# Patient Record
Sex: Female | Born: 1965 | Race: White | Hispanic: No | Marital: Married | Smoking: Never smoker
Health system: Southern US, Community
[De-identification: ages and names within clinical notes are randomized; demographics above are authoritative.]

## PROBLEM LIST (undated history)

## (undated) DIAGNOSIS — E785 Hyperlipidemia, unspecified: Secondary | ICD-10-CM

## (undated) DIAGNOSIS — E039 Hypothyroidism, unspecified: Secondary | ICD-10-CM

## (undated) HISTORY — DX: Hyperlipidemia, unspecified: E78.5

## (undated) HISTORY — DX: Hypothyroidism, unspecified: E03.9

---

## 1998-01-10 ENCOUNTER — Other Ambulatory Visit: Admission: RE | Admit: 1998-01-10 | Discharge: 1998-01-10 | Payer: Self-pay | Admitting: Obstetrics and Gynecology

## 1998-06-27 ENCOUNTER — Inpatient Hospital Stay (HOSPITAL_COMMUNITY): Admission: AD | Admit: 1998-06-27 | Discharge: 1998-06-27 | Payer: Self-pay | Admitting: Obstetrics and Gynecology

## 1998-07-03 ENCOUNTER — Inpatient Hospital Stay (HOSPITAL_COMMUNITY): Admission: AD | Admit: 1998-07-03 | Discharge: 1998-07-07 | Payer: Self-pay | Admitting: Obstetrics and Gynecology

## 1998-07-29 ENCOUNTER — Encounter (HOSPITAL_COMMUNITY): Admission: RE | Admit: 1998-07-29 | Discharge: 1998-10-27 | Payer: Self-pay | Admitting: Obstetrics & Gynecology

## 1998-08-04 ENCOUNTER — Other Ambulatory Visit: Admission: RE | Admit: 1998-08-04 | Discharge: 1998-08-04 | Payer: Self-pay | Admitting: Obstetrics and Gynecology

## 2000-04-22 ENCOUNTER — Other Ambulatory Visit: Admission: RE | Admit: 2000-04-22 | Discharge: 2000-04-22 | Payer: Self-pay | Admitting: Obstetrics and Gynecology

## 2001-05-05 ENCOUNTER — Other Ambulatory Visit: Admission: RE | Admit: 2001-05-05 | Discharge: 2001-05-05 | Payer: Self-pay | Admitting: Obstetrics and Gynecology

## 2002-06-24 ENCOUNTER — Ambulatory Visit (HOSPITAL_COMMUNITY): Admission: RE | Admit: 2002-06-24 | Discharge: 2002-06-24 | Payer: Self-pay | Admitting: Student

## 2002-06-24 ENCOUNTER — Encounter: Payer: Self-pay | Admitting: Obstetrics and Gynecology

## 2003-04-02 ENCOUNTER — Encounter: Payer: Self-pay | Admitting: Obstetrics and Gynecology

## 2003-04-02 ENCOUNTER — Inpatient Hospital Stay (HOSPITAL_COMMUNITY): Admission: RE | Admit: 2003-04-02 | Discharge: 2003-04-06 | Payer: Self-pay | Admitting: Obstetrics and Gynecology

## 2003-04-03 ENCOUNTER — Encounter (INDEPENDENT_AMBULATORY_CARE_PROVIDER_SITE_OTHER): Payer: Self-pay | Admitting: *Deleted

## 2003-04-07 ENCOUNTER — Encounter: Payer: Self-pay | Admitting: Internal Medicine

## 2003-04-07 ENCOUNTER — Encounter: Payer: Self-pay | Admitting: Emergency Medicine

## 2003-04-08 ENCOUNTER — Observation Stay (HOSPITAL_COMMUNITY): Admission: AD | Admit: 2003-04-08 | Discharge: 2003-04-08 | Payer: Self-pay | Admitting: Emergency Medicine

## 2003-05-11 ENCOUNTER — Other Ambulatory Visit: Admission: RE | Admit: 2003-05-11 | Discharge: 2003-05-11 | Payer: Self-pay | Admitting: Obstetrics and Gynecology

## 2004-07-04 ENCOUNTER — Ambulatory Visit: Payer: Self-pay | Admitting: Internal Medicine

## 2004-07-07 ENCOUNTER — Ambulatory Visit: Payer: Self-pay | Admitting: Internal Medicine

## 2004-07-14 ENCOUNTER — Ambulatory Visit: Payer: Self-pay | Admitting: Internal Medicine

## 2004-08-17 ENCOUNTER — Other Ambulatory Visit: Admission: RE | Admit: 2004-08-17 | Discharge: 2004-08-17 | Payer: Self-pay | Admitting: Obstetrics and Gynecology

## 2005-03-26 ENCOUNTER — Ambulatory Visit: Payer: Self-pay | Admitting: Internal Medicine

## 2005-05-18 ENCOUNTER — Ambulatory Visit: Payer: Self-pay | Admitting: Internal Medicine

## 2005-05-25 ENCOUNTER — Ambulatory Visit: Payer: Self-pay | Admitting: Internal Medicine

## 2005-09-26 ENCOUNTER — Ambulatory Visit: Payer: Self-pay | Admitting: Internal Medicine

## 2005-10-01 ENCOUNTER — Ambulatory Visit: Payer: Self-pay | Admitting: Internal Medicine

## 2005-10-15 ENCOUNTER — Other Ambulatory Visit: Admission: RE | Admit: 2005-10-15 | Discharge: 2005-10-15 | Payer: Self-pay | Admitting: Obstetrics and Gynecology

## 2006-03-25 ENCOUNTER — Ambulatory Visit: Payer: Self-pay | Admitting: Internal Medicine

## 2006-03-29 ENCOUNTER — Ambulatory Visit: Payer: Self-pay | Admitting: Internal Medicine

## 2006-05-31 ENCOUNTER — Ambulatory Visit: Payer: Self-pay | Admitting: Internal Medicine

## 2006-07-03 ENCOUNTER — Ambulatory Visit: Payer: Self-pay | Admitting: Internal Medicine

## 2006-12-19 ENCOUNTER — Ambulatory Visit: Payer: Self-pay | Admitting: Internal Medicine

## 2006-12-19 LAB — CONVERTED CEMR LAB
T3, Free: 3.4 pg/mL (ref 2.3–4.2)
T4, Total: 5.9 ug/dL (ref 5.0–12.5)
TSH: 1 microintl units/mL (ref 0.35–5.50)

## 2007-05-06 ENCOUNTER — Telehealth: Payer: Self-pay | Admitting: Internal Medicine

## 2007-05-12 ENCOUNTER — Ambulatory Visit: Payer: Self-pay | Admitting: Internal Medicine

## 2007-05-12 LAB — CONVERTED CEMR LAB
Mumps IgG: 3.89 — ABNORMAL HIGH
Rubella: 114 intl units/mL — ABNORMAL HIGH

## 2007-05-15 ENCOUNTER — Encounter: Payer: Self-pay | Admitting: Internal Medicine

## 2007-06-17 ENCOUNTER — Telehealth: Payer: Self-pay | Admitting: *Deleted

## 2007-06-18 ENCOUNTER — Telehealth: Payer: Self-pay | Admitting: *Deleted

## 2007-06-19 ENCOUNTER — Ambulatory Visit: Payer: Self-pay | Admitting: Internal Medicine

## 2007-06-19 LAB — CONVERTED CEMR LAB: Rubeola IgG: 1.88 — ABNORMAL HIGH

## 2007-07-28 ENCOUNTER — Ambulatory Visit: Payer: Self-pay | Admitting: Internal Medicine

## 2007-08-25 ENCOUNTER — Telehealth: Payer: Self-pay | Admitting: Internal Medicine

## 2007-08-27 ENCOUNTER — Ambulatory Visit: Payer: Self-pay | Admitting: Internal Medicine

## 2007-08-27 DIAGNOSIS — E039 Hypothyroidism, unspecified: Secondary | ICD-10-CM | POA: Insufficient documentation

## 2007-08-27 DIAGNOSIS — E785 Hyperlipidemia, unspecified: Secondary | ICD-10-CM

## 2007-08-27 LAB — CONVERTED CEMR LAB
Cholesterol: 176 mg/dL (ref 0–200)
Free T4: 0.8 ng/dL (ref 0.6–1.6)
HDL: 85.1 mg/dL (ref >39.0)
LDL Cholesterol: 83 mg/dL (ref 0–99)
T3, Free: 2.9 pg/mL (ref 2.3–4.2)
Total CHOL/HDL Ratio: 2.1
Triglycerides: 39 mg/dL (ref 0–149)
VLDL: 8 mg/dL (ref 0–40)

## 2007-09-05 ENCOUNTER — Telehealth: Payer: Self-pay | Admitting: *Deleted

## 2007-12-09 ENCOUNTER — Ambulatory Visit: Payer: Self-pay | Admitting: Internal Medicine

## 2007-12-10 ENCOUNTER — Telehealth: Payer: Self-pay | Admitting: Internal Medicine

## 2008-02-09 ENCOUNTER — Ambulatory Visit: Payer: Self-pay | Admitting: Internal Medicine

## 2008-02-09 LAB — CONVERTED CEMR LAB
Free T4: 0.8 ng/dL (ref 0.6–1.6)
T3, Free: 3.6 pg/mL (ref 2.3–4.2)
TSH: 0.64 microintl units/mL (ref 0.35–5.50)

## 2008-02-16 ENCOUNTER — Telehealth: Payer: Self-pay | Admitting: *Deleted

## 2008-06-11 ENCOUNTER — Ambulatory Visit: Payer: Self-pay | Admitting: Internal Medicine

## 2008-06-15 ENCOUNTER — Ambulatory Visit: Payer: Self-pay | Admitting: Internal Medicine

## 2009-08-01 ENCOUNTER — Ambulatory Visit: Payer: Self-pay | Admitting: Internal Medicine

## 2009-09-21 ENCOUNTER — Telehealth: Payer: Self-pay | Admitting: Internal Medicine

## 2009-09-29 ENCOUNTER — Ambulatory Visit: Payer: Self-pay | Admitting: Internal Medicine

## 2009-09-29 DIAGNOSIS — T887XXA Unspecified adverse effect of drug or medicament, initial encounter: Secondary | ICD-10-CM

## 2009-10-06 ENCOUNTER — Telehealth: Payer: Self-pay | Admitting: Internal Medicine

## 2009-10-06 LAB — CONVERTED CEMR LAB
Albumin: 4.3 g/dL (ref 3.5–5.2)
Calcium: 9.6 mg/dL (ref 8.4–10.5)
Free T4: 0.5 ng/dL — ABNORMAL LOW (ref 0.6–1.6)
T3, Free: 4.4 pg/mL — ABNORMAL HIGH (ref 2.3–4.2)
TSH: 0.06 microintl units/mL — ABNORMAL LOW (ref 0.35–5.50)
Total Bilirubin: 0.8 mg/dL (ref 0.3–1.2)

## 2009-11-18 ENCOUNTER — Ambulatory Visit: Payer: Self-pay | Admitting: Internal Medicine

## 2009-11-18 DIAGNOSIS — J069 Acute upper respiratory infection, unspecified: Secondary | ICD-10-CM | POA: Insufficient documentation

## 2009-12-30 ENCOUNTER — Ambulatory Visit: Payer: Self-pay | Admitting: Internal Medicine

## 2009-12-30 DIAGNOSIS — R3915 Urgency of urination: Secondary | ICD-10-CM

## 2009-12-30 LAB — CONVERTED CEMR LAB
Bilirubin Urine: NEGATIVE
Glucose, Urine, Semiquant: NEGATIVE
Protein, U semiquant: NEGATIVE
Specific Gravity, Urine: 1.01
pH: 7

## 2010-01-03 ENCOUNTER — Ambulatory Visit: Payer: Self-pay | Admitting: Internal Medicine

## 2010-01-03 LAB — CONVERTED CEMR LAB
T3, Free: 3.8 pg/mL (ref 2.3–4.2)
TSH: 0.19 microintl units/mL — ABNORMAL LOW (ref 0.35–5.50)
Vit D, 25-Hydroxy: 33 ng/mL (ref 30–89)

## 2010-01-13 ENCOUNTER — Telehealth: Payer: Self-pay | Admitting: Internal Medicine

## 2010-06-13 ENCOUNTER — Ambulatory Visit: Payer: Self-pay | Admitting: Sports Medicine

## 2010-06-13 DIAGNOSIS — M25569 Pain in unspecified knee: Secondary | ICD-10-CM | POA: Insufficient documentation

## 2010-06-13 DIAGNOSIS — M21619 Bunion of unspecified foot: Secondary | ICD-10-CM

## 2010-06-13 DIAGNOSIS — M2020 Hallux rigidus, unspecified foot: Secondary | ICD-10-CM

## 2010-06-21 ENCOUNTER — Ambulatory Visit: Payer: Self-pay | Admitting: Family Medicine

## 2010-06-23 ENCOUNTER — Encounter: Payer: Self-pay | Admitting: Family Medicine

## 2010-06-23 ENCOUNTER — Telehealth: Payer: Self-pay | Admitting: Family Medicine

## 2010-08-07 ENCOUNTER — Encounter: Payer: Self-pay | Admitting: Internal Medicine

## 2010-09-28 NOTE — Progress Notes (Signed)
Summary: Thyroid Panel appt, please.  Phone Note Call from Patient   Caller: Patient Call For: Stacie Glaze MD Summary of Call: Pt needs an order for full Thyroid Panel per Dr. Lovell Sheehan, she states.   [id number] Initial call taken by: Lynann Beaver CMA,  September 21, 2009 8:33 AM  Follow-up for Phone Call        ov given Follow-up by: Willy Eddy, LPN,  September 21, 2009 8:49 AM

## 2010-09-28 NOTE — Progress Notes (Signed)
Summary: results needed  Phone Note Call from Patient Call back at Home Phone [phone]   Caller: Patient---live call Reason for Call: Talk to Nurse, Lab or Test Results Summary of Call: requesting lab results from last week. Initial call taken by: Warnell Forester,  Jan 13, 2010 12:50 PM  Follow-up for Phone Call        pt informed wnl Follow-up by: Willy Eddy, LPN,  Jan 13, 2010 12:52 PM

## 2010-09-28 NOTE — Progress Notes (Signed)
Summary: pt req abx  Phone Note Call from Patient Call back at Home Phone [phone]   Caller: Patient Call For: Stacie Glaze MD Summary of Call: Pt saw dr Caryl Never on 06/21/2010 for bronchitis she is requesting abx cough no better. [facility] [phone] Initial call taken by: Heron Sabins,  June 23, 2010 9:06 AM  Follow-up for Phone Call        OK to call in Zithromax (Z-pack) use as directed. Follow-up by: Evelena Peat MD,  June 23, 2010 9:35 AM  Additional Follow-up for Phone Call Additional follow up Details #1::        Rx done, pt informed Additional Follow-up by: Sid Falcon LPN,  June 23, 2010 10:15 AM    New/Updated Medications: ZITHROMAX 1 GM PACK (AZITHROMYCIN) use as directed Prescriptions: ZITHROMAX 1 GM PACK (AZITHROMYCIN) use as directed  #1 x 0   Entered by:   Sid Falcon LPN   Authorized by:   Evelena Peat MD   Signed by:   Sid Falcon LPN on 86/57/8469   Method used:   Electronically to        [facility] (retail)       [address]       [place], [place]  [address]       Ph: [phone]       Fax: [phone]   RxID:   [id number]

## 2010-09-28 NOTE — Assessment & Plan Note (Signed)
Summary: BRONCHITIS? // RS   Vital Signs:  Patient profile:   45 year old female Weight:      135 pounds Temp:     98.1 degrees F oral BP sitting:   120 / 82  (left arm) Cuff size:   regular  Vitals Entered By: Sid Falcon LPN (June 21, 2010 9:55 AM)  History of Present Illness: Patient here with 2 to three-day history of nonproductive cough. Cough worse at night. Not relieved with over-the-counter medications. Some mild sinus pressure but no purulent nasal discharge. Questionable low-grade fever. Does have some body aches and fatigue. Denies any sore throat.  ex-smoker  Allergies: 1)  ! Codeine  Past History:  Past Medical History: Last updated: 11/18/2009 Hyperlipidemia Hypothyroidism  Social History: Last updated: 04/18/2007 Former Smoker Alcohol use-yes Drug use-no  Risk Factors: Smoking Status: never (12/30/2009) PMH-FH-SH reviewed for relevance  Review of Systems      See HPI  Physical Exam  General:  Well-developed,well-nourished,in no acute distress; alert,appropriate and cooperative throughout examination Ears:  External ear exam shows no significant lesions or deformities.  Otoscopic examination reveals clear canals, tympanic membranes are intact bilaterally without bulging, retraction, inflammation or discharge. Hearing is grossly normal bilaterally. Mouth:  Oral mucosa and oropharynx without lesions or exudates.  Teeth in good repair. Neck:  No deformities, masses, or tenderness noted. Lungs:  Normal respiratory effort, chest expands symmetrically. Lungs are clear to auscultation, no crackles or wheezes. Heart:  Normal rate and regular rhythm. S1 and S2 normal without gallop, murmur, click, rub or other extra sounds. Skin:  no rashes.     Impression & Recommendations:  Problem # 1:  ACUTE BRONCHITIS (ICD-466.0)  suspect viral. Cough suppressant prescribed.  Her updated medication list for this problem includes:    Hydrocodone-homatropine 5-1.5  Mg/67ml Syrp (Hydrocodone-homatropine) ..... One tsp by mouth q 4-6 hours as needed cough  Complete Medication List: 1)  Synthroid 75 Mcg Tabs (Levothyroxine sodium) .... One by mouth daily 2)  Vytorin 10-20 Mg Tabs (Ezetimibe-simvastatin) .... 1/2 tablet once daily 3)  Cytomel 5 Mcg Tabs (Liothyronine sodium) .... Two  by mouth daily 4)  Caltrate 600 1500 Mg Tabs (Calcium carbonate) .Marland Kitchen.. 1 two times a day 5)  Vitamin D3 2000 Unit Caps (Cholecalciferol) .... One by mouth daily 6)  Hydrocodone-homatropine 5-1.5 Mg/34ml Syrp (Hydrocodone-homatropine) .... One tsp by mouth q 4-6 hours as needed cough  Other Orders: Admin 1st Vaccine (16109) Flu Vaccine 32yrs + (60454)  Patient Instructions: 1)  Acute Bronchitis symptoms for less then 10 days are not  helped by antibiotics. Take over the counter cough medications. Call if no improvement in 5-7 days, sooner if increasing cough, fever, or new symptoms ( shortness of breath, chest pain) .  Prescriptions: HYDROCODONE-HOMATROPINE 5-1.5 MG/5ML SYRP (HYDROCODONE-HOMATROPINE) one tsp by mouth q 4-6 hours as needed cough  #120 ml x 0   Entered and Authorized by:   Evelena Peat MD   Signed by:   Evelena Peat MD on 06/21/2010   Method used:   Print then Give to Patient   RxID:   [id number]    Orders Added: 1)  Admin 1st Vaccine [90471] 2)  Flu Vaccine 65yrs + [30865] 3)  Est. Patient Level III [78469]   Flu Vaccine Consent Questions     Do you have a history of severe allergic reactions to this vaccine? no    Any prior history of allergic reactions to egg and/or gelatin? no    Do you  have a sensitivity to the preservative Thimersol? no    Do you have a past history of Guillan-Barre Syndrome? no    Do you currently have an acute febrile illness? no    Have you ever had a severe reaction to latex? no    Vaccine information given and explained to patient? yes    Are you currently pregnant? no    Lot Number:[id number]   Exp  Date:02/24/2011   Site Given  Left Deltoid IM .lbflu1

## 2010-09-28 NOTE — Miscellaneous (Signed)
Summary: Z-pack correction  Clinical Lists Changes  Medications: Changed medication from ZITHROMAX 1 GM PACK (AZITHROMYCIN) use as directed to [facility] 1 GM PACK (AZITHROMYCIN)    Correction made for Z-pack pills Sid Falcon LPN  June 23, 2010 1:25 PM

## 2010-09-28 NOTE — Assessment & Plan Note (Signed)
Summary: 8:45APP,NP,RUNNERS KNEE,TRAINING FOR RACE,MC   Vital Signs:  Patient profile:   45 year old female Height:      62 inches Weight:      130 pounds Pulse rate:   76 / minute BP sitting:   117 / 79  (right arm)  Vitals Entered By: Rochele Pages RN (June 13, 2010 8:40 AM) CC: bilat knee pain   CC:  bilat knee pain.  History of Present Illness: 45 yo F with bilateral knee pain. She has recently gotten back into running and is up to 23 miles per week. She now has pain in both knees after 2-4 miles. The pain started in her R leg, but now is in both. It is particularly bad while going up and down hills. She has no pain when running on the treadmill. After running, she is sore for a few hours but treats with ice and ibuprofen and the pain resolves.  She has had bunions on both feet, R>L since she was in college. They have not progressed significantly.  Allergies: 1)  ! Codeine  Physical Exam  General:  Well-developed,well-nourished,in no acute distress; alert,appropriate and cooperative throughout examination Msk:  Knee: Knees symmetric bilaterally without edema or effusion.  Pain to palpation below kneecap on lateral side bilaterally. no tenderness at joint line.  Full ROM with flexion/extension without pain.  5/5 strength on abduction, adduction, flexion at hip.  Neg Lachman's, Neg McMurray's, Neg drawer tests.  Feet: Bunions on both feet, R>L. Pronation at midfoot on R. Good heel alignment.   Hallux rigidis with limited extension/flexion to 30 degrees/40 degrees respectively on R. Neurologic:  Gait: Forefoot strike in running gait.   Impression & Recommendations:  Problem # 1:  KNEE PAIN, BILATERAL (ICD-719.46)  Knee pain likely from stress at foot impact. Gave sports insoles, knee strap to help with stress on knee. Instructed in exercises for hip strength and quad strength: hip adduction, hip raise and internal rotation, runner's squat, runner's lunge. She will do  at least one set per day of each of the exercises. Gave sheets for instruction.  Orders: Garment,belt,sleeve or other covering ,elastic or similar stretch (W0981)  Problem # 2:  BUNIONS, BILATERAL (ICD-727.1)  Bilateral bunions with R>L, and correlating R>L knee pain. Gave sports insoles, additional padding for bunion on R foot. She will return in 1 month to be fitted for custom orthotics.  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  used a first ray post on temp insoles  Orders: Sports Insoles (W2956)  Complete Medication List: 1)  Synthroid 75 Mcg Tabs (Levothyroxine sodium) .... One by mouth daily 2)  Vytorin 10-20 Mg Tabs (Ezetimibe-simvastatin) .... 1/2 tablet once daily 3)  Cytomel 5 Mcg Tabs (Liothyronine sodium) .... Two  by mouth daily 4)  Caltrate 600 1500 Mg Tabs (Calcium carbonate) .Marland Kitchen.. 1 two times a day 5)  Vitamin D3 2000 Unit Caps (Cholecalciferol) .... One by mouth daily   Orders Added: 1)  Garment,belt,sleeve or other covering ,elastic or similar stretch [A4466] 2)  Sports Insoles [L3510] 3)  New Patient Level II [21308]

## 2010-09-28 NOTE — Progress Notes (Signed)
  Phone Note Call from Patient   Caller: Patient Call For: Stacie Glaze MD Complaint: Urinary/GYN Problems Summary of Call: returning Dr York Ram call about lab results. [phone] [facility] Initial call taken by: Raechel Ache, RN,  October 06, 2009 9:37 AM  Follow-up for Phone Call        change med Follow-up by: Stacie Glaze MD,  October 06, 2009 10:00 AM    New/Updated Medications: SYNTHROID 75 MCG TABS (LEVOTHYROXINE SODIUM) one by mouth daily CYTOMEL 5 MCG TABS (LIOTHYRONINE SODIUM) two  by mouth daily CALTRATE 600 1500 MG TABS (CALCIUM CARBONATE) 1 two times a day VITAMIN D3 2000 UNIT CAPS (CHOLECALCIFEROL) one by mouth daily Prescriptions: CYTOMEL 5 MCG TABS (LIOTHYRONINE SODIUM) two  by mouth daily  #60 x 11   Entered and Authorized by:   Stacie Glaze MD   Signed by:   Stacie Glaze MD on 10/06/2009   Method used:   Electronically to        [facility] (retail)       [address]       [place], [place]  [address]       Ph: [phone]       Fax: [phone]   RxID:   [id number] SYNTHROID 75 MCG TABS (LEVOTHYROXINE SODIUM) one by mouth daily  #30 x 11   Entered and Authorized by:   Stacie Glaze MD   Signed by:   Stacie Glaze MD on 10/06/2009   Method used:   Electronically to        [facility] (retail)       [address]       [place], [place]  [address]       Ph: [phone]       Fax: [phone]   RxID:   [id number]

## 2010-09-28 NOTE — Assessment & Plan Note (Signed)
Summary: thyoid/bmw   Vital Signs:  Patient profile:   45 year old female Height:      65 inches Weight:      130 pounds BMI:     21.71 Temp:     98.2 degrees F oral Pulse rate:   72 / minute Resp:     14 per minute BP sitting:   106 / 74  (left arm)  Vitals Entered By: Willy Eddy, LPN (September 29, 2009 12:18 PM) CC: roa- for labs   CC:  roa- for labs.  History of Present Illness: follow up with  hypothyroidism   Preventive Screening-Counseling & Management  Alcohol-Tobacco     Smoking Status: quit  Pap Smear  Procedure date:  04/27/2009  Findings:      Specimen Adequacy: Satisfactory for evaluation.   Interpretation/Result:Negative for intraepithelial Lesion or Malignancy.     Current Problems (verified): 1)  Hypothyroidism  (ICD-244.9) 2)  Hyperlipidemia  (ICD-272.4)  Current Medications (verified): 1)  Synthroid 50 Mcg Tabs (Levothyroxine Sodium) .Marland Kitchen.. 1 Once Daily 2)  Vytorin 10-20 Mg  Tabs (Ezetimibe-Simvastatin) .... 1/2 Tablet Once Daily 3)  Cytomel 25 Mcg Tabs (Liothyronine Sodium) .Marland Kitchen.. 1 Once Daily  Allergies (verified): 1)  ! Codeine  Past History:  Social History: Last updated: 04/18/2007 Former Smoker Alcohol use-yes Drug use-no  Risk Factors: Smoking Status: quit (09/29/2009)  Past medical, surgical, family and social histories (including risk factors) reviewed, and no changes noted (except as noted below).  Family History: Reviewed history and no changes required.  Social History: Reviewed history from 04/18/2007 and no changes required. Former Smoker Alcohol use-yes Drug use-no  Review of Systems  The patient denies anorexia, fever, weight loss, weight gain, vision loss, decreased hearing, hoarseness, chest pain, syncope, dyspnea on exertion, peripheral edema, prolonged cough, headaches, hemoptysis, abdominal pain, melena, hematochezia, severe indigestion/heartburn, hematuria, incontinence, genital sores, muscle  weakness, suspicious skin lesions, transient blindness, difficulty walking, depression, unusual weight change, abnormal bleeding, enlarged lymph nodes, angioedema, and breast masses.    Physical Exam  General:  Well-developed,well-nourished,in no acute distress; alert,appropriate and cooperative throughout examination Head:  Normocephalic and atraumatic without obvious abnormalities. No apparent alopecia or balding. Ears:  R ear normal and L ear normal.   Nose:  no external deformity.   Mouth:  no gingival abnormalities.   Lungs:  Normal respiratory effort, chest expands symmetrically. Lungs are clear to auscultation, no crackles or wheezes. Heart:  Normal rate and regular rhythm. S1 and S2 normal without gallop, murmur, click, rub or other extra sounds. Abdomen:  Bowel sounds positive,abdomen soft and non-tender without masses, organomegaly or hernias noted. Msk:  No deformity or scoliosis noted of thoracic or lumbar spine.   Pulses:  R and L carotid,radial,femoral,dorsalis pedis and posterior tibial pulses are full and equal bilaterally Extremities:  No clubbing, cyanosis, edema, or deformity noted with normal full range of motion of all joints.   Neurologic:  No cranial nerve deficits noted. Station and gait are normal. Plantar reflexes are down-going bilaterally. DTRs are symmetrical throughout. Sensory, motor and coordinative functions appear intact.   Impression & Recommendations:  Problem # 1:  HYPOTHYROIDISM (ICD-244.9) Assessment Unchanged  Her updated medication list for this problem includes:    Synthroid 50 Mcg Tabs (Levothyroxine sodium) .Marland Kitchen... 1 once daily    Cytomel 25 Mcg Tabs (Liothyronine sodium) .Marland Kitchen... 1 once daily  Labs Reviewed: TSH: 0.64 (02/09/2008)   Free T4: 0.8 (02/09/2008)    Chol: 176 (08/27/2007)   HDL:  85.1 (08/27/2007)   LDL: 83 (08/27/2007)   TG: 39 (08/27/2007)  Orders: TLB-TSH (Thyroid Stimulating Hormone) (84443-TSH) TLB-T4 (Thyrox), Free  [phone]) TLB-T3, Free (Triiodothyronine) (84481-T3FREE) T-Vitamin D (25-Hydroxy) (40981-19147) TLB-Calcium (82310-CA) Venipuncture (82956) TLB-Hepatic/Liver Function Pnl (80076-HEPATIC)  Problem # 2:  HYPERLIPIDEMIA (ICD-272.4) Assessment: Unchanged  Her updated medication list for this problem includes:    Vytorin 10-20 Mg Tabs (Ezetimibe-simvastatin) .Marland Kitchen... 1/2 tablet once daily    HDL:85.1 (08/27/2007)  LDL:83 (08/27/2007)  Chol:176 (08/27/2007)  Trig:39 (08/27/2007)  Orders: TLB-Cholesterol, HDL (83718-HDL) TLB-Cholesterol, Direct LDL (83721-DIRLDL) TLB-Cholesterol, Total (82465-CHO)  Complete Medication List: 1)  Synthroid 50 Mcg Tabs (Levothyroxine sodium) .Marland Kitchen.. 1 once daily 2)  Vytorin 10-20 Mg Tabs (Ezetimibe-simvastatin) .... 1/2 tablet once daily 3)  Cytomel 25 Mcg Tabs (Liothyronine sodium) .Marland Kitchen.. 1 once daily  Patient Instructions: 1)  Please schedule a follow-up appointment in 6 months.  Prevention & Chronic Care Immunizations   Influenza vaccine: Fluvax 3+  (08/01/2009)   Influenza vaccine due: 04/27/2010    Tetanus booster: 08/27/1996: Historical    Pneumococcal vaccine: Not documented   Pneumococcal vaccine deferral: Not indicated  (09/29/2009)  Other Screening   Pap smear: Specimen Adequacy: Satisfactory for evaluation.   Interpretation/Result:Negative for intraepithelial Lesion or Malignancy.     (04/27/2009)   Pap smear due: 04/27/2010    Mammogram: Not documented   Smoking status: quit  (09/29/2009)  Lipids   Total Cholesterol: 176  (08/27/2007)   Lipid panel action/deferral: Lipid Panel ordered   LDL: 83  (08/27/2007)   LDL Direct: Not documented   HDL: 85.1  (08/27/2007)   Triglycerides: 39  (08/27/2007)    SGOT (AST): Not documented   BMP action: Ordered   SGPT (ALT): Not documented   Alkaline phosphatase: Not documented   Total bilirubin: Not documented  Self-Management Support :    Patient will work on the following items until  the next clinic visit to reach self-care goals:     Eating: eat more vegetables, eat foods that are low in salt  (09/29/2009)     Activity: take a 30 minute walk every day  (09/29/2009)    Lipid self-management support: Written self-care plan  (09/29/2009)   Lipid self-care plan printed.   Nursing Instructions: Give tetanus booster today   Appended Document: Orders Update    Clinical Lists Changes  Orders: Added new Service order of Tdap => 51yrs IM (21308) - Signed Added new Service order of Admin 1st Vaccine (65784) - Signed Observations: Added new observation of TD BOOSTERLO: [id number] (09/29/2009 13:06) Added new observation of TD BOOST EXP: 03/25/2011 (09/29/2009 13:06) Added new observation of TD BOOSTERBY: Willy Eddy, LPN (84/13/2440 13:06) Added new observation of TD BOOSTERRT: IM (09/29/2009 13:06) Added new observation of TDBOOSTERDSE: 0.5 ml (09/29/2009 13:06) Added new observation of TD BOOSTERMF: San[employer]i Paste[employer] (09/29/2009 13:06) Added new observation of TD BOOST SIT: left deltoid (09/29/2009 13:06) Added new observation of TD BOOSTER: Tdap (09/29/2009 13:06)       Immunizations Administered:  Tetanus Vaccine:    Vaccine Type: Tdap    Site: left deltoid    Mfr: San[employer]i Pasteur    Dose: 0.5 ml    Route: IM    Given by: Willy Eddy, LPN    Exp. Date: 03/25/2011    Lot #: [id number]

## 2010-09-28 NOTE — Assessment & Plan Note (Signed)
Summary: URI//---OK PER BONYYE TO SEE DR K//CCM  A the and a and a and a a and and the  Vital Signs:  Patient profile:   45 year old female Weight:      139 pounds Temp:     98.2 degrees F oral BP sitting:   117 / 76  (left arm) Cuff size:   regular  Vitals Entered By: Duard Brady LPN (November 18, 2009 9:23 AM) CC: c/o head congestion - pressure, cough, headache - was travelling Is Patient Diabetic? No   CC:  c/o head congestion - pressure, cough, and headache - was travelling.  History of Present Illness: 45 year old patient, who presents with a 3-day history of head congestion, cough, and mild sore throat.  There is been no fever, localized sinus pain were severe headache.  There is been mild rhinorrhea.  No purulent sputum production.  She has treated hypothyroidism and dyslipidemia  Preventive Screening-Counseling & Management  Alcohol-Tobacco     Smoking Status: never  Allergies: 1)  ! Codeine  Past History:  Past Medical History: Hyperlipidemia Hypothyroidism  Social History: Smoking Status:  never  Review of Systems       The patient complains of prolonged cough.  The patient denies anorexia, fever, weight loss, weight gain, vision loss, decreased hearing, hoarseness, chest pain, syncope, dyspnea on exertion, peripheral edema, headaches, hemoptysis, abdominal pain, melena, hematochezia, severe indigestion/heartburn, hematuria, incontinence, genital sores, muscle weakness, suspicious skin lesions, transient blindness, difficulty walking, depression, unusual weight change, abnormal bleeding, enlarged lymph nodes, angioedema, and breast masses.    Physical Exam  General:  Well-developed,well-nourished,in no acute distress; alert,appropriate and cooperative throughout examination Head:  Normocephalic and atraumatic without obvious abnormalities. No apparent alopecia or balding. Eyes:  No corneal or conjunctival inflammation noted. EOMI. Perrla. Funduscopic exam  benign, without hemorrhages, exudates or papilledema. Vision grossly normal. Ears:  External ear exam shows no significant lesions or deformities.  Otoscopic examination reveals clear canals, tympanic membranes are intact bilaterally without bulging, retraction, inflammation or discharge. Hearing is grossly normal bilaterally. Nose:  External nasal examination shows no deformity or inflammation. Nasal mucosa are pink and moist without lesions or exudates. Mouth:  pharyngeal erythema.  pharyngeal erythema.   Neck:  No deformities, masses, or tenderness noted. Lungs:  Normal respiratory effort, chest expands symmetrically. Lungs are clear to auscultation, no crackles or wheezes.   Impression & Recommendations:  Problem # 1:  URI (ICD-465.9)  Problem # 2:  HYPERLIPIDEMIA (ICD-272.4)  Her updated medication list for this problem includes:    Vytorin 10-20 Mg Tabs (Ezetimibe-simvastatin) .Marland Kitchen... 1/2 tablet once daily  Her updated medication list for this problem includes:    Vytorin 10-20 Mg Tabs (Ezetimibe-simvastatin) .Marland Kitchen... 1/2 tablet once daily  Complete Medication List: 1)  Synthroid 75 Mcg Tabs (Levothyroxine sodium) .... One by mouth daily 2)  Vytorin 10-20 Mg Tabs (Ezetimibe-simvastatin) .... 1/2 tablet once daily 3)  Cytomel 5 Mcg Tabs (Liothyronine sodium) .... Two  by mouth daily 4)  Caltrate 600 1500 Mg Tabs (Calcium carbonate) .Marland Kitchen.. 1 two times a day 5)  Vitamin D3 2000 Unit Caps (Cholecalciferol) .... One by mouth daily  Patient Instructions: 1)  Please schedule a follow-up appointment as needed. 2)  Get plenty of rest, drink lots of clear liquids, and use Tylenol or Ibuprofen for fever and comfort. Return in 7-10 days if you're not better:sooner if you're feeling worse. 3)  MUCINEX D- ONE TWICE DAILY 4)  OMNARIS- USE TWICE DAILY

## 2010-09-28 NOTE — Assessment & Plan Note (Signed)
Summary: uti/dm   Vital Signs:  Patient profile:   45 year old female Height:      65 inches Weight:      139 pounds BMI:     23.21 Temp:     98.4 degrees F oral Pulse rate:   72 / minute Resp:     14 per minute BP sitting:   110 / 70  (left arm)  Vitals Entered By: Willy Eddy, LPN (Dec 30, 1608 3:33 PM) CC: c/o dysuria yesterday, but gone today but does have low back pain   CC:  c/o dysuria yesterday and but gone today but does have low back pain.  History of Present Illness: had urgency and burning  for 24 hours felt better then  wories about the weekend occurring the UA was negative  Preventive Screening-Counseling & Management  Alcohol-Tobacco     Smoking Status: never  Problems Prior to Update: 1)  Urinary Urgency  (ICD-788.63) 2)  Uri  (ICD-465.9) 3)  Uns Advrs Eff Uns Rx Medicinal&biological Sbstnc  (ICD-995.20) 4)  Hypothyroidism  (ICD-244.9) 5)  Hyperlipidemia  (ICD-272.4)  Current Problems (verified): 1)  Uri  (ICD-465.9) 2)  Uns Advrs Eff Uns Rx Medicinal&biological Sbstnc  (ICD-995.20) 3)  Hypothyroidism  (ICD-244.9) 4)  Hyperlipidemia  (ICD-272.4)  Medications Prior to Update: 1)  Synthroid 75 Mcg Tabs (Levothyroxine Sodium) .... One By Mouth Daily 2)  Vytorin 10-20 Mg  Tabs (Ezetimibe-Simvastatin) .... 1/2 Tablet Once Daily 3)  Cytomel 5 Mcg Tabs (Liothyronine Sodium) .... Two  By Mouth Daily 4)  Caltrate 600 1500 Mg Tabs (Calcium Carbonate) .Marland Kitchen.. 1 Two Times A Day 5)  Vitamin D3 2000 Unit Caps (Cholecalciferol) .... One By Mouth Daily  Current Medications (verified): 1)  Synthroid 75 Mcg Tabs (Levothyroxine Sodium) .... One By Mouth Daily 2)  Vytorin 10-20 Mg  Tabs (Ezetimibe-Simvastatin) .... 1/2 Tablet Once Daily 3)  Cytomel 5 Mcg Tabs (Liothyronine Sodium) .... Two  By Mouth Daily 4)  Caltrate 600 1500 Mg Tabs (Calcium Carbonate) .Marland Kitchen.. 1 Two Times A Day 5)  Vitamin D3 2000 Unit Caps (Cholecalciferol) .... One By Mouth Daily  Allergies  (verified): 1)  ! Codeine  Review of Systems  The patient denies anorexia, fever, weight loss, weight gain, vision loss, decreased hearing, hoarseness, chest pain, syncope, dyspnea on exertion, peripheral edema, prolonged cough, headaches, hemoptysis, abdominal pain, melena, hematochezia, severe indigestion/heartburn, hematuria, incontinence, genital sores, muscle weakness, suspicious skin lesions, transient blindness, difficulty walking, depression, unusual weight change, abnormal bleeding, enlarged lymph nodes, angioedema, breast masses, and testicular masses.    Physical Exam  General:  Well-developed,well-nourished,in no acute distress; alert,appropriate and cooperative throughout examination Head:  Normocephalic and atraumatic without obvious abnormalities. No apparent alopecia or balding. Ears:  R ear normal and L ear normal.   Lungs:  Normal respiratory effort, chest expands symmetrically. Lungs are clear to auscultation, no crackles or wheezes. Heart:  Normal rate and regular rhythm. S1 and S2 normal without gallop, murmur, click, rub or other extra sounds.   Impression & Recommendations:  Problem # 1:  URINARY URGENCY (RUE-454.09) ua negative dicussed drinkin water and use of cranberry juice for mild symptoms discussion of symptoms and how to treat symptoms and when to come to the office  Complete Medication List: 1)  Synthroid 75 Mcg Tabs (Levothyroxine sodium) .... One by mouth daily 2)  Vytorin 10-20 Mg Tabs (Ezetimibe-simvastatin) .... 1/2 tablet once daily 3)  Cytomel 5 Mcg Tabs (Liothyronine sodium) .... Two  by  mouth daily 4)  Caltrate 600 1500 Mg Tabs (Calcium carbonate) .Marland Kitchen.. 1 two times a day 5)  Vitamin D3 2000 Unit Caps (Cholecalciferol) .... One by mouth daily  Other Orders: UA Dipstick w/o Micro (automated)  (81003)  Patient Instructions: 1)  cranberry juice and azo  Laboratory Results   Urine Tests    Routine Urinalysis   Color: yellow Appearance:  Clear Glucose: negative   (Normal Range: Negative) Bilirubin: negative   (Normal Range: Negative) Ketone: negative   (Normal Range: Negative) Spec. Gravity: 1.010   (Normal Range: 1.003-1.035) Blood: trace-intact   (Normal Range: Negative) pH: 7.0   (Normal Range: 5.0-8.0) Protein: negative   (Normal Range: Negative) Urobilinogen: 0.2   (Normal Range: 0-1) Nitrite: negative   (Normal Range: Negative) Leukocyte Esterace: negative   (Normal Range: Negative)    Comments: Rita Ohara  Dec 30, 2009 3:10 PM

## 2010-10-19 ENCOUNTER — Other Ambulatory Visit: Payer: Self-pay | Admitting: Internal Medicine

## 2011-01-12 NOTE — Discharge Summary (Signed)
[name], [name]                        ACCOUNT NO.:  [id number]   MEDICAL RECORD NO.:  [id number]                   PATIENT TYPE:  INP   LOCATION:  9126                                 FACILITY:  [facility]   PHYSICIAN:  Freddy Finner, M.D.                DATE OF BIRTH:  11-Mar-1966   DATE OF ADMISSION:  04/02/2003  DATE OF DISCHARGE:  04/06/2003                                 DISCHARGE SUMMARY   ADMITTING DIAGNOSES:  1. Intrauterine pregnancy 37-2/7 weeks estimated gestational age.  2. Pregnancy-induced hypertension.  3. Previous cesarean delivery, desires repeat.   DISCHARGE DIAGNOSES:  1. Status post low-transverse cesarean section secondary to nonreassuring     fetal heart tones.  2. Viable female infant.   PROCEDURE:  Repeat low-transverse cesarean section.   REASON FOR ADMISSION:  Please see dictated H&P.   HOSPITAL COURSE:  The patient is a 45 year old white married female, gravida  3, para 1, at 37-2/7 weeks estimated gestational age who was admitted to  [facility] after a routine office visit where patient  complained of marked pedal edema, mild headache, and blurred vision.  Blood  pressure in the office was 140s/80s and trace proteinuria.  The patient was  then sent to the maternity admissions unit for further evaluation.   Blood pressure was noted to be in the 120s/70s.  PIH laboratories were  within normal limits.  Nonstress test was performed which revealed late  decelerations after a single contraction.  The decision was then made to  proceed with the delivery.  Ultrasound was performed which revealed an  estimated fetal weight of 4100-4400 g.  The patient had had a previous  cesarean delivery and she desired a repeat.   The patient was then transferred to the operating room where spinal  anesthesia was administered without difficulty.  A low-transverse incision  was made with the delivery of a viable female infant weighing 8 pounds 10  ounces  with APGARS of 9 at one minute, 9 at five minutes.  Umbilical cord pH  was 7.39.  The patient tolerated the procedure well and was transferred to  the ASCU where magnesium sulfate was administered x24 hours.   On postoperative day #1, the vital signs were stable.  Blood pressure was  stable.  She remained afebrile.  Abdomen was slightly distended, but had  good return of bowel function.  Abdominal dressing was clean, dry, and  intact.  Laboratories were normal with exception of potassium level which  was 3.0.  Potassium runs were given x3 and magnesium sulfate was  discontinued after 24 hours.   On postoperative day #2, the patient was doing well.  Vital signs were  stable.  Abdomen was soft.  Fundus was firm and nontender.  Abdominal  dressing was removed revealing an incision that was clean, dry, and intact.  Potassium level was 3.0.  She was now given K-Dur  20 mEq b.i.d.   On postoperative day #3, vital signs were stable.  The patient remained  afebrile.  Blood pressure was 110/80.  Deep tendon reflexes were 1+ without  clonus.  She continued to have 2+ pitting edema.  Fundus was firm and  nontender.  The incision was clean, dry, and intact.  Slight erythema was  noted superior to the incisional site left of the midline.  Laboratories  revealed hemoglobin 9.9, platelet count 297,000, WBC count 15.1.  Potassium  level was now 3.1.  K-Dur was now increased to t.i.d.   On postoperative day #4, the patient was without complaints.  Vital signs  were stable.  Fundus firm and nontender.  Abdomen was soft.  The incision  was clean, dry, and intact.  Staples were removed and patient was discharged  home.  Laboratories were repeated with potassium level of 4.1.   CONDITION ON DISCHARGE:  Good.   DIET:  Regular, as tolerated.   ACTIVITY:  No heavy lifting.  No driving x2 weeks.  No vaginal entry.   FOLLOWUP:  The patient is to follow up in one week for an incision check and  repeat  potassium level.  She is to call for temperature greater than 100  degrees, persistent nausea and vomiting, heavy vaginal bleeding, and/or  redness or drainage of the incisional site.   DISCHARGE MEDICATIONS:  1. Darvocet-N 100, #20, one p.o. q.4-6 h. p.r.n. pain.  2. Motrin 600 mg q.6 h.  3. Prenatal vitamins one p.o. daily.  4. Colace one p.o. daily p.r.n.     Julio Sicks, N.P.                        Freddy Finner, M.D.    CC/MEDQ  D:  04/23/2003  T:  04/23/2003  Job:  [id number]

## 2011-01-12 NOTE — Op Note (Signed)
[name], [name]                        ACCOUNT NO.:  [id number]   MEDICAL RECORD NO.:  [id number]                   PATIENT TYPE:  INP   LOCATION:  9373                                 FACILITY:  [facility]   PHYSICIAN:  Guy Sandifer. Arleta Creek, M.D.           DATE OF BIRTH:  Mar 25, 1966   DATE OF PROCEDURE:  04/02/2003  DATE OF DISCHARGE:                                 OPERATIVE REPORT   PREOPERATIVE DIAGNOSES:  1. Intrauterine pregnancy at 37-1/7 weeks' estimated gestational age.  2. Pregnancy-induced hypertension.  3. Previous cesarean section, desires repeat.  4. Nonreassuring fetal heart tracing.   POSTOPERATIVE DIAGNOSES:  1. Intrauterine pregnancy at 37-1/7 weeks' estimated gestational age.  2. Pregnancy-induced hypertension.  3. Previous cesarean section, desires repeat.  4. Nonreassuring fetal heart tracing.   PROCEDURE:  Low transverse cesarean section.   SURGEON:  Guy Sandifer. Henderson Cloud, M.D.   ANESTHESIA:  Spinal, Belva Agee, M.D.   ESTIMATED BLOOD LOSS:  800 mL.   SPECIMENS:  Placenta.   FINDINGS:  A viable female infant, Apgars of 9 and 9 at one and five minutes,  respectively.  Birth weight pending.  Arterial cord pH 7.39.   INDICATIONS AND CONSENT:  This patient is a 45 year old married white  female, G2, P5, with EDC of April 21, 2003, placing her at 37-1/7 weeks.  Prenatal care was complicated by a history of pregnancy-induced hypertension  and subsequent low transverse C-section with pregnancy #1.  The group B  strep culture is negative.  The patient was seen in the office on March 30, 2003, had swelling, and pregnancy-induced hypertension labs at that time  were normal.  Today she returns to the office with marked pedal edema, mild  headache, and blurry vision on and off.  Blood pressure in the office was  140s/80s with a trace of protein.  She was sent to triage for evaluation.  Blood pressures there were 120s/70s.  Fetal heart tones were reactive,  but  there was a late deceleration after a single contraction.  Ultrasound  earlier today was consistent with an EFW of 4100-4400 and vertex.  Pregnancy-  induced hypertension labs returned as normal today.  Delivery is  recommended.  The patient desires cesarean section.  Risks and complications  were discussed preoperatively, including but not limited to infection,  bowel, bladder, or ureteral damage, bleeding requiring transfusion of blood  products with possible transfusion reaction, HIV and hepatitis acquisition,  DVT, PE, pneumonia.  All questions were answered and consent is signed on  the chart.   DESCRIPTION OF PROCEDURE:  The patient is taken to the operating room, where  a spinal anesthetic is placed.  She is then placed in the dorsal supine  position with a 15 degree left lateral wedge, where she is prepped  abdominally, a Foley catheter is placed and the bladder is drained, and she  is draped in a sterile fashion.  After assessing for adequate spinal  anesthesia, skin is entered through the previous Pfannenstiel scar.  Dissection is carried out in layers to the peritoneum, which is incised and  extended superiorly and inferiorly.  The vesicouterine peritoneum is taken  down cephalolaterally, the bladder flap is developed, and the bladder blade  is placed.  The uterus is incised in a low transverse manner and the uterine  cavity is entered bluntly with a hemostat.  The uterine incision is extended  cephalolaterally with the fingers.  Artificial rupture of membranes with  clear fluid is carried out.  The vertex is delivered and the oropharynx and  nasopharynx are suctioned.  The remainder of the infant is delivered, and a  good cry and tone are noted.  The cord is clamped and cut, and the infant is  handed to the waiting pediatrics team.  The placenta is manually delivered  and sent to pathology.  The uterine cavity is clean and does have a heart  shape at the superior portion.   The uterine incision is closed in a running  locking fashion with 0 Monocryl suture, which achieves good hemostasis.  Tubes and ovaries are normal bilaterally.  The anterior peritoneum is closed  in a running fashion with 0 Monocryl suture, which is also used to  reapproximate the pyramidalis muscle in the midline.  The bladder is noted  to be extended up somewhat higher than its normal position on the anterior  peritoneal wall to the left of the incision.  This is discussed with the  patient.  The rectus fascia is then closed in a running fashion with 0 PDS  suture.  The scarring in the subcutaneous tissues is undermined with  cautery, good hemostasis is obtained, and the skin is closed with clips.  All sponge, instrument, and needle counts are correct, and the patient is  transferred to recovery in stable condition.                                               Guy Sandifer Arleta Creek, M.D.    JET/MEDQ  D:  04/02/2003  T:  04/03/2003  Job:  [id number]

## 2011-06-11 ENCOUNTER — Other Ambulatory Visit: Payer: Self-pay | Admitting: *Deleted

## 2011-06-11 MED ORDER — LIOTHYRONINE SODIUM 5 MCG PO TABS: 5.0000 ug | ORAL_TABLET | Freq: Every day | ORAL | Status: DC

## 2011-07-16 ENCOUNTER — Other Ambulatory Visit: Payer: Self-pay | Admitting: *Deleted

## 2011-07-16 MED ORDER — LIOTHYRONINE SODIUM 5 MCG PO TABS: 5.0000 ug | ORAL_TABLET | Freq: Every day | ORAL | Status: DC

## 2011-08-08 ENCOUNTER — Telehealth: Payer: Self-pay

## 2011-08-08 ENCOUNTER — Ambulatory Visit (INDEPENDENT_AMBULATORY_CARE_PROVIDER_SITE_OTHER): Payer: BC Managed Care – PPO | Admitting: Family Medicine

## 2011-08-08 ENCOUNTER — Encounter: Payer: Self-pay | Admitting: Family Medicine

## 2011-08-08 VITALS — BP 128/72 | HR 114 | Temp 98.3°F | Wt 133.0 lb

## 2011-08-08 DIAGNOSIS — J40 Bronchitis, not specified as acute or chronic: Secondary | ICD-10-CM

## 2011-08-08 MED ORDER — AZITHROMYCIN 250 MG PO TABS: ORAL_TABLET | ORAL | Status: AC

## 2011-08-08 NOTE — Telephone Encounter (Signed)
Has ov with dr fry today

## 2011-08-08 NOTE — Progress Notes (Signed)
  Subjective:    Patient ID: [name], female    DOB: 1966/08/18, 45 y.o.   MRN: [id number]  HPI Here for 2 weeks of stuffy head, PND, chest congestion, and coughing up green sputum.    Review of Systems  Constitutional: Negative.   HENT: Positive for congestion, postnasal drip and sinus pressure.   Eyes: Negative.   Respiratory: Positive for cough.        Objective:   Physical Exam  Constitutional: She appears well-developed and well-nourished.  HENT:  Right Ear: External ear normal.  Left Ear: External ear normal.  Nose: Nose normal.  Mouth/Throat: Oropharynx is clear and moist. No oropharyngeal exudate.  Eyes: Conjunctivae are normal.  Neck: No thyromegaly present.  Pulmonary/Chest: Effort normal and breath sounds normal.  Lymphadenopathy:    She has no cervical adenopathy.          Assessment & Plan:  Add Mucinex.

## 2011-08-08 NOTE — Telephone Encounter (Signed)
Pt has ov with dr fry today

## 2011-08-08 NOTE — Telephone Encounter (Signed)
Pt states she had a cold close to Thanksgiving but it went away and now it has come back. Pt has had cough, upper respiratory congestion, and sinus problems for 5 days.  Pt denies fever.  Pt would like rx sent to pharmacy or suggestions for medications to take.  Pls advise.

## 2011-08-29 ENCOUNTER — Other Ambulatory Visit: Payer: Self-pay | Admitting: *Deleted

## 2011-08-29 MED ORDER — LIOTHYRONINE SODIUM 5 MCG PO TABS: 5.0000 ug | ORAL_TABLET | Freq: Every day | ORAL | Status: DC

## 2011-10-17 ENCOUNTER — Telehealth: Payer: Self-pay | Admitting: *Deleted

## 2011-10-17 MED ORDER — CIPROFLOXACIN HCL 500 MG PO TABS: 500.0000 mg | ORAL_TABLET | Freq: Two times a day (BID) | ORAL | Status: AC

## 2011-10-17 NOTE — Telephone Encounter (Signed)
Notified pt. 

## 2011-10-17 NOTE — Telephone Encounter (Signed)
Pt is asking if Dr. Lovell Sheehan will treat her without an office visit for an UTI.  Symptoms are cloudy urine, urgency, dysuria, frequency, and no fever.

## 2011-10-17 NOTE — Telephone Encounter (Signed)
Per dr jenkins- may have cipro 500 bid for 3 days 

## 2011-10-26 ENCOUNTER — Other Ambulatory Visit: Payer: Self-pay | Admitting: Internal Medicine

## 2011-11-05 ENCOUNTER — Ambulatory Visit (INDEPENDENT_AMBULATORY_CARE_PROVIDER_SITE_OTHER): Payer: BC Managed Care – PPO | Admitting: Family

## 2011-11-05 ENCOUNTER — Encounter: Payer: Self-pay | Admitting: Family

## 2011-11-05 VITALS — BP 110/70 | Temp 98.1°F | Wt 138.0 lb

## 2011-11-05 DIAGNOSIS — N390 Urinary tract infection, site not specified: Secondary | ICD-10-CM

## 2011-11-05 DIAGNOSIS — R300 Dysuria: Secondary | ICD-10-CM

## 2011-11-05 LAB — POCT URINALYSIS DIPSTICK
Glucose, UA: NEGATIVE
Ketones, UA: NEGATIVE
Spec Grav, UA: 1.015
Urobilinogen, UA: 0.2

## 2011-11-05 MED ORDER — NITROFURANTOIN MONOHYD MACRO 100 MG PO CAPS: 100.0000 mg | ORAL_CAPSULE | Freq: Two times a day (BID) | ORAL | Status: AC

## 2011-11-05 NOTE — Progress Notes (Signed)
  Subjective:    Patient ID: [name], female    DOB: 07-Dec-1965, 46 y.o.   MRN: [id number]  HPI Comments: C/o cloudy urine, urgency, frequency, and pressure with urination x several weeks. Was seen by MD Lovell Sheehan and prescribed cipro several weeks ago and s/s never went away. 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 Does not drink water very often     Review of Systems  Constitutional: Negative for fever and chills.  Respiratory: Negative.   Cardiovascular: Negative.   Gastrointestinal: Negative for abdominal pain, diarrhea, constipation and abdominal distention.  Genitourinary: Positive for dysuria, urgency, frequency and hematuria. Negative for flank pain, decreased urine volume, enuresis, difficulty urinating, menstrual problem, pelvic pain and dyspareunia.   Past Medical History  Diagnosis Date  . Hyperlipidemia   . Hypothyroidism     History   Social History  . Marital Status: Married    Spouse Name: N/A    Number of Children: N/A  . Years of Education: N/A   Occupational History  . Not on file.   Social History Main Topics  . Smoking status: Never Smoker   . Smokeless tobacco: Never Used  . Alcohol Use: 1.5 oz/week    3 drink(s) per week  . Drug Use: No  . Sexually Active: Not on file   Other Topics Concern  . Not on file   Social History Narrative  . No narrative on file    No past surgical history on file.  No family history on file.  Allergies  Allergen Reactions  . Codeine     REACTION: hives    Current Outpatient Prescriptions on File Prior to Visit  Medication Sig Dispense Refill  . CYTOMEL 5 MCG tablet TAKE TWO TABLETS BY MOUTH DAILY  60 each  9  . ezetimibe-simvastatin (VYTORIN) 10-10 MG per tablet Take 1 tablet by mouth at bedtime. Take 1/2 tablet       . liothyronine (CYTOMEL) 5 MCG tablet Take 1 tablet (5 mcg total) by mouth daily.  90 tablet   3  . SYNTHROID 75 MCG tablet TAKE ONE TABLET BY MOUTH ONE TIME DAILY  30 each  9    BP 110/70  Temp(Src) 98.1 F (36.7 C) (Oral)  Wt 138 lb (62.596 kg)chart     Objective:   Physical Exam  Constitutional: She is oriented to person, place, and time.  Cardiovascular: Normal rate, regular rhythm, normal heart sounds and intact distal pulses.  Exam reveals no gallop and no friction rub.   No murmur heard. Pulmonary/Chest: Effort normal and breath sounds normal. No respiratory distress. She has no wheezes. She has no rales. She exhibits no tenderness.  Abdominal: Soft. Bowel sounds are normal. She exhibits no distension and no mass. There is no tenderness. There is no rebound and no guarding.  Neurological: She is alert and oriented to person, place, and time.  Skin: Skin is warm.  Psychiatric: She has a normal mood and affect.          Assessment & Plan:  Assessment: Uncomplicated lower urinary cystitis Plan: Macrobid, urine c & s, OTC cranberry tablets, force po fluids (water) and cut down on caffeine intake, go to bathroom when have urge and do not hold urine, void after sex, teaching handouts  provided diagnosis and treatment

## 2011-12-07 ENCOUNTER — Other Ambulatory Visit: Payer: Self-pay | Admitting: Internal Medicine

## 2011-12-10 ENCOUNTER — Other Ambulatory Visit: Payer: Self-pay | Admitting: Internal Medicine

## 2012-05-27 ENCOUNTER — Other Ambulatory Visit: Payer: Self-pay | Admitting: *Deleted

## 2012-05-27 MED ORDER — LIOTHYRONINE SODIUM 5 MCG PO TABS: 5.0000 ug | ORAL_TABLET | Freq: Every day | ORAL | Status: DC

## 2012-06-03 ENCOUNTER — Ambulatory Visit (INDEPENDENT_AMBULATORY_CARE_PROVIDER_SITE_OTHER): Payer: BC Managed Care – PPO | Admitting: Family

## 2012-06-03 ENCOUNTER — Encounter: Payer: Self-pay | Admitting: Family

## 2012-06-03 VITALS — BP 138/92 | HR 108 | Wt 151.0 lb

## 2012-06-03 DIAGNOSIS — E7800 Pure hypercholesterolemia, unspecified: Secondary | ICD-10-CM

## 2012-06-03 DIAGNOSIS — E039 Hypothyroidism, unspecified: Secondary | ICD-10-CM

## 2012-06-03 DIAGNOSIS — Z23 Encounter for immunization: Secondary | ICD-10-CM

## 2012-06-03 DIAGNOSIS — Z0000 Encounter for general adult medical examination without abnormal findings: Secondary | ICD-10-CM

## 2012-06-03 LAB — COMPREHENSIVE METABOLIC PANEL
ALT: 15 U/L (ref 0–35)
Albumin: 4.2 g/dL (ref 3.5–5.2)
CO2: 29 mEq/L (ref 19–32)
Calcium: 9.8 mg/dL (ref 8.4–10.5)
Chloride: 102 mEq/L (ref 96–112)
GFR: 61.35 mL/min (ref >60.00)
Glucose, Bld: 96 mg/dL (ref 70–99)
Potassium: 4.8 mEq/L (ref 3.5–5.1)
Sodium: 140 mEq/L (ref 135–145)
Total Protein: 7.6 g/dL (ref 6.0–8.3)

## 2012-06-03 LAB — T4, FREE: Free T4: 0.76 ng/dL (ref 0.60–1.60)

## 2012-06-03 LAB — TSH: TSH: 0.75 u[IU]/mL (ref 0.35–5.50)

## 2012-06-03 LAB — CBC WITH DIFFERENTIAL/PLATELET
Basophils Absolute: 0 10*3/uL (ref 0.0–0.1)
Eosinophils Relative: 1.6 % (ref 0.0–5.0)
HCT: 40.2 % (ref 36.0–46.0)
Lymphocytes Relative: 23.8 % (ref 12.0–46.0)
Lymphs Abs: 1.3 10*3/uL (ref 0.7–4.0)
Monocytes Relative: 6.5 % (ref 3.0–12.0)
Neutrophils Relative %: 67.5 % (ref 43.0–77.0)
Platelets: 326 10*3/uL (ref 150.0–400.0)
RDW: 14.6 % (ref 11.5–14.6)
WBC: 5.4 10*3/uL (ref 4.5–10.5)

## 2012-06-03 LAB — LIPID PANEL
Total CHOL/HDL Ratio: 2
Triglycerides: 71 mg/dL (ref 0.0–149.0)
VLDL: 14.2 mg/dL (ref 0.0–40.0)

## 2012-06-03 LAB — T3, FREE: T3, Free: 3.4 pg/mL (ref 2.3–4.2)

## 2012-06-03 NOTE — Patient Instructions (Addendum)

## 2012-06-03 NOTE — Progress Notes (Signed)
  Subjective:    Patient ID: [name], female    DOB: 13-May-1966, 46 y.o.   MRN: [id number]  HPI 46 year old white female, nonsmoker, patient of Dr. Lovell Sheehan is in today requesting to have blood work drawn. She has not had a complete physical in several years. However, she does see Dr. Marcelle Overlie for Pap smears and is up-to-date on her mammogram. Has concerns of weight loss or weight gain alternating over the last year. She has a history of hypothyroidism. Does report cessation of exercise the last year. Previously, she had been exercise an hour and a half a day. She's currently taken Vytorin, Cytomel, Synthroid and tolerating the medications well.  Patient also has concerns of elevated blood pressure. She was seen at the dentist to have a blood pressure 131/90. No known history of hypertension in the past.   Review of Systems     Objective:   Physical Exam   EKG: Normal sinus rhythm with left axis deviation.     Assessment & Plan:  Assessment: Complete physical exam, hypothyroidism, hyperlipidemia, weight gain, elevated blood pressure  Plan: Lab sent to include lipids, CBC, CMP, TSH will notify patient of her results. Encouraged healthy diet, exercise, weight reduction to reduce her blood pressure nonpharmacologically. TSH obtain to evaluate her weight loss of hypothyroidism. Will follow with patient and the results of her labs, in 6 months with her PCP, and sooner when necessary.

## 2012-06-30 ENCOUNTER — Other Ambulatory Visit: Payer: Self-pay | Admitting: *Deleted

## 2012-08-05 ENCOUNTER — Telehealth: Payer: Self-pay | Admitting: Internal Medicine

## 2012-08-05 NOTE — Telephone Encounter (Signed)
He will be back on Thursday and I will show to him then

## 2012-08-05 NOTE — Telephone Encounter (Signed)
Pt would like Dr. Lovell Sheehan to review chart and let her know if there is any additional labs that need to be done because of her weight gain. She asked that he "eyeball" because he is her PCP and is an "endocrinologist at heart"

## 2012-08-05 NOTE — Telephone Encounter (Signed)
What do you suggest?  She had cpx in October?

## 2012-08-05 NOTE — Telephone Encounter (Signed)
Talked with pt and she is aware it will be Thursday before dr Lovell Sheehan returns to look at chart

## 2012-08-05 NOTE — Telephone Encounter (Signed)
Pt concerned about rapid weight gain, and elevated BP. Wants to know if you think additional blood work needed or referral or see MD? ?Thyroid? Pls call pt at [phone].

## 2012-08-05 NOTE — Telephone Encounter (Signed)
She has a CPX in October. No additional blood works needs to be done. Thyroid was normal then as well as T3 and T4. Blood pressure is mildly elevated at 131/90 at last office visit. Patient can control her blood pressure with diet and daily exercise ( a day) at this point. No med necessary but if she prefers, we can consider HCTZ. Either way exercise is essential.

## 2012-08-07 NOTE — Telephone Encounter (Signed)
Per dr Lovell Sheehan- please draw aic-pt informed

## 2012-08-08 ENCOUNTER — Other Ambulatory Visit (INDEPENDENT_AMBULATORY_CARE_PROVIDER_SITE_OTHER): Payer: BC Managed Care – PPO

## 2012-08-08 DIAGNOSIS — R635 Abnormal weight gain: Secondary | ICD-10-CM

## 2012-08-08 LAB — HEMOGLOBIN A1C: Hgb A1c MFr Bld: 5.4 % (ref 4.6–6.5)

## 2012-08-15 ENCOUNTER — Telehealth: Payer: Self-pay | Admitting: Internal Medicine

## 2012-08-15 NOTE — Telephone Encounter (Signed)
Pt would like blood work results °

## 2012-08-18 NOTE — Telephone Encounter (Signed)
Left message on machine aic was normal- call back next week and find out what the next step iss

## 2012-11-08 ENCOUNTER — Other Ambulatory Visit: Payer: Self-pay | Admitting: Internal Medicine

## 2012-12-26 ENCOUNTER — Encounter: Payer: Self-pay | Admitting: Family Medicine

## 2012-12-26 ENCOUNTER — Ambulatory Visit (INDEPENDENT_AMBULATORY_CARE_PROVIDER_SITE_OTHER): Payer: BC Managed Care – PPO | Admitting: Family Medicine

## 2012-12-26 VITALS — BP 118/86 | Temp 98.0°F | Wt 162.0 lb

## 2012-12-26 DIAGNOSIS — J309 Allergic rhinitis, unspecified: Secondary | ICD-10-CM

## 2012-12-26 MED ORDER — BENZONATATE 100 MG PO CAPS: 100.0000 mg | ORAL_CAPSULE | Freq: Three times a day (TID) | ORAL | Status: DC | PRN

## 2012-12-26 MED ORDER — MOMETASONE FUROATE 50 MCG/ACT NA SUSP: 2.0000 | Freq: Every day | NASAL | Status: DC

## 2012-12-26 NOTE — Progress Notes (Signed)
Chief Complaint  Patient presents with  . 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  Past Medical History  Diagnosis Date  . Hyperlipidemia   . Hypothyroidism     No family history on file.  History   Social History  . Marital Status: Married    Spouse Name: N/A    Number of Children: N/A  . Years of Education: N/A   Social History Main Topics  . Smoking status: Never Smoker   . Smokeless tobacco: Never Used  . Alcohol Use: 1.5 oz/week    3 drink(s) per week  . Drug Use: No  . Sexually Active: None   Other Topics Concern  . None   Social History Narrative  . None    Current outpatient prescriptions:CYTOMEL 5 MCG tablet, TAKE TWO TABLETS BY MOUTH DAILY, Disp: 60 tablet, Rfl: 8;  ezetimibe-simvastatin (VYTORIN) 10-10 MG per tablet, Take 1 tablet by mouth at bedtime. Take 1/2 tablet , Disp: , Rfl: ;  SYNTHROID 75 MCG tablet, TAKE ONE TABLET BY MOUTH ONE TIME DAILY, Disp: 30 tablet, Rfl: 8;  VYTORIN 10-20 MG per tablet, TAKE HALF TABLET BY MOUTH DAILY, Disp: 15 each, Rfl: 6 benzonatate (TESSALON PERLES) 100 MG capsule, Take 1 capsule (100 mg total) by mouth 3 (three) times daily as needed for cough., Disp: 20 capsule, Rfl: 0;  mometasone (NASONEX) 50 MCG/ACT nasal spray, Place 2 sprays into the nose daily., Disp: 17 g, Rfl: 2  EXAM:  Filed Vitals:   12/26/12 0756  BP: 118/86  Temp: 98 F (36.7 C)    Body mass index is 29.62 kg/(m^2).  GENERAL: vitals reviewed and  listed above, alert, oriented, appears well hydrated and in no acute distress  HEENT: atraumatic, conjunttiva mildly erythematous, lids a little swollen, no obvious abnormalities on inspection of external nose and ears, normal appearance of ear canals and TMs, clear nasal congestion, pale boggy turbinates, mild post oropharyngeal erythema with PND, no tonsillar edema or exudate, no sinus TTP  NECK: no obvious masses on inspection  LUNGS: clear to auscultation bilaterally, no wheezes, rales or rhonchi, good air movement  CV: HRRR, no peripheral edema  MS: moves all extremities without noticeable abnormality  PSYCH: pleasant and cooperative, no obvious depression or anxiety  ASSESSMENT AND PLAN:  Discussed the following assessment and plan:  Allergic rhinitis - Plan: mometasone (NASONEX) 50 MCG/ACT nasal spray, benzonatate (TESSALON PERLES) 100 MG capsule  -AR on exam, tx per orders and below -Patient advised to return or notify a doctor immediately if symptoms worsen or persist or new concerns arise.  Patient Instructions    -plenty of rest and fluids  -take zyrtec at night every day  -nasonex daily  -nasal saline wash 2-3 times daily (use prepackaged nasal saline or bottled/distilled water if making your own)   -clean nose with nasal saline before using the nasal steroid or sinex  -can use sinex nasal spray for drainage and nasal congestion - but do NOT use longer  then 3-4 days  -can use tylenol or ibuprofen as directed for aches and sorethroat  -in the winter time, using a humidifier at night is helpful (please follow cleaning instructions)  -if you are taking a cough medication - use only as directed, may also try a teaspoon of honey to coat the throat and throat lozenges  -for sore throat, salt water gargles can help  -follow up if you have fevers, facial pain, tooth pain, difficulty breathing or are worsening       Zacariah Belue R.

## 2012-12-26 NOTE — Patient Instructions (Addendum)
 -  plenty of rest and fluids  -take zyrtec at night every day  -nasonex daily  -nasal saline wash 2-3 times daily (use prepackaged nasal saline or bottled/distilled water if making your own)   -clean nose with nasal saline before using the nasal steroid or sinex  -can use sinex nasal spray for drainage and nasal congestion - but do NOT use longer then 3-4 days  -can use tylenol or ibuprofen as directed for aches and sorethroat  -in the winter time, using a humidifier at night is helpful (please follow cleaning instructions)  -if you are taking a cough medication - use only as directed, may also try a teaspoon of honey to coat the throat and throat lozenges  -for sore throat, salt water gargles can help  -follow up if you have fevers, facial pain, tooth pain, difficulty breathing or are worsening

## 2013-07-02 ENCOUNTER — Other Ambulatory Visit: Payer: Self-pay

## 2014-01-08 ENCOUNTER — Ambulatory Visit: Payer: BC Managed Care – PPO | Admitting: Physician Assistant

## 2014-01-08 ENCOUNTER — Ambulatory Visit (INDEPENDENT_AMBULATORY_CARE_PROVIDER_SITE_OTHER): Payer: BC Managed Care – PPO | Admitting: Physician Assistant

## 2014-01-08 ENCOUNTER — Encounter: Payer: Self-pay | Admitting: Physician Assistant

## 2014-01-08 VITALS — BP 130/84 | HR 88 | Temp 97.9°F | Resp 20 | Wt 145.0 lb

## 2014-01-08 DIAGNOSIS — Z09 Encounter for follow-up examination after completed treatment for conditions other than malignant neoplasm: Secondary | ICD-10-CM

## 2014-01-08 DIAGNOSIS — Z0184 Encounter for antibody response examination: Secondary | ICD-10-CM

## 2014-01-08 NOTE — Progress Notes (Signed)
Pre visit review using our clinic review tool, if applicable. No additional management support is needed unless otherwise documented below in the visit note. 

## 2014-01-08 NOTE — Progress Notes (Signed)
   Subjective:    Patient ID: [name], female    DOB: [birth year], 48 y.o.   MRN: [id number]  HPI Patient is a 48 year old Caucasian female presenting to the clinic for a followup on immunizations and for immunization titers. 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 Patient is without complaints today and reports that she is in good health. Denies fevers, chills, nausea, vomiting, diarrhea, and shortness of breath.   Review of Systems As per the history of present illness and are otherwise negative.  Past Medical History  Diagnosis Date  . Hyperlipidemia   . Hypothyroidism    History reviewed. No pertinent past surgical history.  reports that she has never smoked. She has never used smokeless tobacco. She reports that she drinks about 1.5 ounces of alcohol per week. She reports that she does not use illicit drugs. family history is not on file. Allergies  Allergen Reactions  . Codeine     REACTION: hives       Objective:   Physical Exam  Nursing note and vitals reviewed. Constitutional: She is oriented to person, place, and time. She appears well-developed and well-nourished. No distress.  HENT:  Head: Normocephalic and atraumatic.  Eyes: Conjunctivae and EOM are normal. Pupils are equal, round, and reactive to light.  Neck: Normal range of motion. Neck supple.  Cardiovascular: Normal rate, regular rhythm, normal heart sounds and intact distal pulses.  Exam reveals no gallop and no friction rub.   No murmur heard. Pulmonary/Chest: Effort normal and breath sounds normal. No respiratory distress. She has no wheezes. She has no rales. She exhibits no tenderness.  Musculoskeletal: Normal range of motion.  Neurological: She is alert and oriented to person, place, and time.  Skin: Skin is warm and dry. No rash noted. She is not diaphoretic. No erythema. No pallor.  Psychiatric: She has a normal mood and affect. Her  behavior is normal. Judgment and thought content normal.    Filed Vitals:   01/08/14 1120  BP: 130/84  Pulse: 88  Temp: 97.9 F (36.6 C)  Resp: 20     Lab Results  Component Value Date   WBC 5.4 06/03/2012   HGB 13.2 06/03/2012   HCT 40.2 06/03/2012   PLT 326.0 06/03/2012   GLUCOSE 96 06/03/2012   CHOL 224* 06/03/2012   TRIG 71.0 06/03/2012   HDL 96.40 06/03/2012   LDLDIRECT 109.4 06/03/2012   LDLCALC 83 08/27/2007   ALT 15 06/03/2012   AST 25 06/03/2012   NA 140 06/03/2012   K 4.8 06/03/2012   CL 102 06/03/2012   CREATININE 1.0 06/03/2012   BUN 17 06/03/2012   CO2 29 06/03/2012   TSH 0.75 06/03/2012   HGBA1C 5.4 08/08/2012       Assessment & Plan:  [name] was seen today for follow-up.  Diagnoses and associated orders for this visit:  Antibody response examination - Varicella zoster antibody, IgG - Measles/Mumps/Rubella Immunity - Quantiferon tb gold assay - Hepatitis B surface antibody - Hep A Antibody, Total  Follow up - Had Flu shot from outside source, provided clinic with information of this for her record. - Healthy female with no complaints.  Follow up as needed.   Patient Instructions  You will have lab work drawn today. We will call you with the results of these when they are available.  Follow up as needed.

## 2014-01-08 NOTE — Patient Instructions (Addendum)
You will have lab work drawn today. We will call you with the results of these when they are available.  Follow up as needed.  Health Maintenance, Female A healthy lifestyle and preventative care can promote health and wellness.  Maintain regular health, dental, and eye exams.  Eat a healthy diet. Foods like vegetables, fruits, whole grains, low-fat dairy products, and lean protein foods contain the nutrients you need without too many calories. Decrease your intake of foods high in solid fats, added sugars, and salt. Get information about a proper diet from your caregiver, if necessary.  Regular physical exercise is one of the most important things you can do for your health. Most adults should get at least 150 minutes of moderate-intensity exercise (any activity that increases your heart rate and causes you to sweat) each week. In addition, most adults need muscle-strengthening exercises on 2 or more days a week.   Maintain a healthy weight. The body mass index (BMI) is a screening tool to identify possible weight problems. It provides an estimate of body fat based on height and weight. Your caregiver can help determine your BMI, and can help you achieve or maintain a healthy weight. For adults 20 years and older:  A BMI below 18.5 is considered underweight.  A BMI of 18.5 to 24.9 is normal.  A BMI of 25 to 29.9 is considered overweight.  A BMI of 30 and above is considered obese.  Maintain normal blood lipids and cholesterol by exercising and minimizing your intake of saturated fat. Eat a balanced diet with plenty of fruits and vegetables. Blood tests for lipids and cholesterol should begin at age 1 and be repeated every 5 years. If your lipid or cholesterol levels are high, you are over 50, or you are a high risk for heart disease, you may need your cholesterol levels checked more frequently.Ongoing high lipid and cholesterol levels should be treated with medicines if diet and exercise  are not effective.  If you smoke, find out from your caregiver how to quit. If you do not use tobacco, do not start.  Lung cancer screening is recommended for adults aged 41 80 years who are at high risk for developing lung cancer because of a history of smoking. Yearly low-dose computed tomography (CT) is recommended for people who have at least a 30-pack-year history of smoking and are a current smoker or have quit within the past 15 years. A pack year of smoking is smoking an average of 1 pack of cigarettes a day for 1 year (for example: 1 pack a day for 30 years or 2 packs a day for 15 years). Yearly screening should continue until the smoker has stopped smoking for at least 15 years. Yearly screening should also be stopped for people who develop a health problem that would prevent them from having lung cancer treatment.  If you are pregnant, do not drink alcohol. If you are breastfeeding, be very cautious about drinking alcohol. If you are not pregnant and choose to drink alcohol, do not exceed 1 drink per day. One drink is considered to be 12 ounces (355 mL) of beer, 5 ounces (148 mL) of wine, or 1.5 ounces (44 mL) of liquor.  Avoid use of street drugs. Do not share needles with anyone. Ask for help if you need support or instructions about stopping the use of drugs.  High blood pressure causes heart disease and increases the risk of stroke. Blood pressure should be checked at least every 1  to 2 years. Ongoing high blood pressure should be treated with medicines, if weight loss and exercise are not effective.  If you are 53 to 48 years old, ask your caregiver if you should take aspirin to prevent strokes.  Diabetes screening involves taking a blood sample to check your fasting blood sugar level. This should be done once every 3 years, after age 7, if you are within normal weight and without risk factors for diabetes. Testing should be considered at a younger age or be carried out more frequently  if you are overweight and have at least 1 risk factor for diabetes.  Breast cancer screening is essential preventative care for women. You should practice "breast self-awareness." This means understanding the normal appearance and feel of your breasts and may include breast self-examination. Any changes detected, no matter how small, should be reported to a caregiver. Women in their 83s and 30s should have a clinical breast exam (CBE) by a caregiver as part of a regular health exam every 1 to 3 years. After age 48, women should have a CBE every year. Starting at age 60, women should consider having a mammogram (breast X-ray) every year. Women who have a family history of breast cancer should talk to their caregiver about genetic screening. Women at a high risk of breast cancer should talk to their caregiver about having an MRI and a mammogram every year.  Breast cancer gene (BRCA)-related cancer risk assessment is recommended for women who have family members with BRCA-related cancers. BRCA-related cancers include breast, ovarian, tubal, and peritoneal cancers. Having family members with these cancers may be associated with an increased risk for harmful changes (mutations) in the breast cancer genes BRCA1 and BRCA2. Results of the assessment will determine the need for genetic counseling and BRCA1 and BRCA2 testing.  The Pap test is a screening test for cervical cancer. Women should have a Pap test starting at age 65. Between ages 4 and 29, Pap tests should be repeated every 2 years. Beginning at age 83, you should have a Pap test every 3 years as long as the past 3 Pap tests have been normal. If you had a hysterectomy for a problem that was not cancer or a condition that could lead to cancer, then you no longer need Pap tests. If you are between ages 45 and 16, and you have had normal Pap tests going back 10 years, you no longer need Pap tests. If you have had past treatment for cervical cancer or a  condition that could lead to cancer, you need Pap tests and screening for cancer for at least 20 years after your treatment. If Pap tests have been discontinued, risk factors (such as a new sexual partner) need to be reassessed to determine if screening should be resumed. Some women have medical problems that increase the chance of getting cervical cancer. In these cases, your caregiver may recommend more frequent screening and Pap tests.  The human papillomavirus (HPV) test is an additional test that may be used for cervical cancer screening. The HPV test looks for the virus that can cause the cell changes on the cervix. The cells collected during the Pap test can be tested for HPV. The HPV test could be used to screen women aged 76 years and older, and should be used in women of any age who have unclear Pap test results. After the age of 2, women should have HPV testing at the same frequency as a Pap test.  Colorectal  cancer can be detected and often prevented. Most routine colorectal cancer screening begins at the age of 106 and continues through age 39. However, your caregiver may recommend screening at an earlier age if you have risk factors for colon cancer. On a yearly basis, your caregiver may provide home test kits to check for hidden blood in the stool. Use of a small camera at the end of a tube, to directly examine the colon (sigmoidoscopy or colonoscopy), can detect the earliest forms of colorectal cancer. Talk to your caregiver about this at age 31, when routine screening begins. Direct examination of the colon should be repeated every 5 to 10 years through age 62, unless early forms of pre-cancerous polyps or small growths are found.  Hepatitis C blood testing is recommended for all people born from [birth year] through 1965 and any individual with known risks for hepatitis C.  Practice safe sex. Use condoms and avoid high-risk sexual practices to reduce the spread of sexually transmitted infections  (STIs). Sexually active women aged 30 and younger should be checked for Chlamydia, which is a common sexually transmitted infection. Older women with new or multiple partners should also be tested for Chlamydia. Testing for other STIs is recommended if you are sexually active and at increased risk.  Osteoporosis is a disease in which the bones lose minerals and strength with aging. This can result in serious bone fractures. The risk of osteoporosis can be identified using a bone density scan. Women ages 81 and over and women at risk for fractures or osteoporosis should discuss screening with their caregivers. Ask your caregiver whether you should be taking a calcium supplement or vitamin D to reduce the rate of osteoporosis.  Menopause can be associated with physical symptoms and risks. Hormone replacement therapy is available to decrease symptoms and risks. You should talk to your caregiver about whether hormone replacement therapy is right for you.  Use sunscreen. Apply sunscreen liberally and repeatedly throughout the day. You should seek shade when your shadow is shorter than you. Protect yourself by wearing long sleeves, pants, a wide-brimmed hat, and sunglasses year round, whenever you are outdoors.  Notify your caregiver of new moles or changes in moles, especially if there is a change in shape or color. Also notify your caregiver if a mole is larger than the size of a pencil eraser.  Stay current with your immunizations. Document Released: 02/26/2011 Document Revised: 12/08/2012 Document Reviewed: 02/26/2011 [facility] Patient Information 2014 [employer].

## 2014-01-09 LAB — HEPATITIS A ANTIBODY, TOTAL: Hep A Total Ab: BORDERLINE — AB

## 2014-01-09 LAB — MEASLES/MUMPS/RUBELLA IMMUNITY
RUBELLA: 3.24 {index} — AB (ref <0.90)
RUBEOLA IGG: 194 [AU]/ml — AB (ref <25.00)

## 2014-01-09 LAB — HEPATITIS B SURFACE ANTIBODY, QUANTITATIVE: Hepatitis B-Post: 0.1 m[IU]/mL

## 2014-01-09 LAB — VARICELLA ZOSTER ANTIBODY, IGG: Varicella IgG: 593.3 Index — ABNORMAL HIGH (ref <135.00)

## 2014-01-11 LAB — QUANTIFERON TB GOLD ASSAY (BLOOD)
INTERFERON GAMMA RELEASE ASSAY: NEGATIVE
Mitogen value: 4.38 IU/mL
Quantiferon Nil Value: 0.02 IU/mL
Quantiferon Tb Ag Minus Nil Value: 0.01 IU/mL
TB AG VALUE: 0.03 [IU]/mL

## 2014-01-12 ENCOUNTER — Ambulatory Visit (INDEPENDENT_AMBULATORY_CARE_PROVIDER_SITE_OTHER): Payer: BC Managed Care – PPO

## 2014-01-12 DIAGNOSIS — Z23 Encounter for immunization: Secondary | ICD-10-CM

## 2014-01-15 ENCOUNTER — Ambulatory Visit: Payer: BC Managed Care – PPO | Admitting: Physician Assistant

## 2014-02-12 ENCOUNTER — Ambulatory Visit: Payer: BC Managed Care – PPO

## 2014-02-16 ENCOUNTER — Ambulatory Visit (INDEPENDENT_AMBULATORY_CARE_PROVIDER_SITE_OTHER): Payer: BC Managed Care – PPO

## 2014-02-16 DIAGNOSIS — Z23 Encounter for immunization: Secondary | ICD-10-CM

## 2014-02-16 DIAGNOSIS — Z0184 Encounter for antibody response examination: Secondary | ICD-10-CM

## 2014-03-10 ENCOUNTER — Other Ambulatory Visit: Payer: BC Managed Care – PPO

## 2014-03-10 DIAGNOSIS — Z0184 Encounter for antibody response examination: Secondary | ICD-10-CM

## 2014-03-11 ENCOUNTER — Other Ambulatory Visit: Payer: BC Managed Care – PPO

## 2014-03-11 LAB — HEPATITIS B SURFACE ANTIBODY,QUALITATIVE: HEP B S AB: NEGATIVE

## 2014-03-29 ENCOUNTER — Telehealth: Payer: Self-pay | Admitting: Internal Medicine

## 2014-03-29 NOTE — Telephone Encounter (Signed)
Pt would like a copy of her labs. Pt needs to pick up today. Advised pt just to come by and pu.

## 2014-03-29 NOTE — Telephone Encounter (Signed)
Copy of labs are ready for pickup

## 2014-07-16 ENCOUNTER — Ambulatory Visit (INDEPENDENT_AMBULATORY_CARE_PROVIDER_SITE_OTHER): Payer: BC Managed Care – PPO

## 2014-07-16 DIAGNOSIS — Z23 Encounter for immunization: Secondary | ICD-10-CM

## 2017-12-05 ENCOUNTER — Other Ambulatory Visit: Payer: Self-pay | Admitting: Obstetrics and Gynecology

## 2017-12-05 DIAGNOSIS — R928 Other abnormal and inconclusive findings on diagnostic imaging of breast: Secondary | ICD-10-CM

## 2017-12-10 ENCOUNTER — Ambulatory Visit
Admission: RE | Admit: 2017-12-10 | Discharge: 2017-12-10 | Disposition: A | Discharge status: Home / Self Care | Payer: BLUE CROSS/BLUE SHIELD | Source: Ambulatory Visit | Attending: Obstetrics and Gynecology | Admitting: Obstetrics and Gynecology

## 2017-12-10 ENCOUNTER — Other Ambulatory Visit: Payer: Self-pay

## 2017-12-10 DIAGNOSIS — R928 Other abnormal and inconclusive findings on diagnostic imaging of breast: Secondary | ICD-10-CM

## 2018-11-27 ENCOUNTER — Other Ambulatory Visit: Payer: Self-pay | Admitting: Obstetrics and Gynecology

## 2018-11-27 DIAGNOSIS — Z1231 Encounter for screening mammogram for malignant neoplasm of breast: Secondary | ICD-10-CM

## 2019-01-29 ENCOUNTER — Ambulatory Visit
Admission: RE | Admit: 2019-01-29 | Discharge: 2019-01-29 | Disposition: A | Discharge status: Home / Self Care | Payer: BLUE CROSS/BLUE SHIELD | Source: Ambulatory Visit | Attending: Obstetrics and Gynecology | Admitting: Obstetrics and Gynecology

## 2019-01-29 ENCOUNTER — Other Ambulatory Visit: Payer: Self-pay

## 2019-01-29 DIAGNOSIS — Z1231 Encounter for screening mammogram for malignant neoplasm of breast: Secondary | ICD-10-CM

## 2019-07-17 ENCOUNTER — Other Ambulatory Visit: Payer: Self-pay

## 2019-07-17 DIAGNOSIS — Z20822 Contact with and (suspected) exposure to covid-19: Secondary | ICD-10-CM

## 2019-07-20 LAB — NOVEL CORONAVIRUS, NAA: SARS-CoV-2, NAA: NOT DETECTED

## 2019-11-05 DIAGNOSIS — Z23 Encounter for immunization: Secondary | ICD-10-CM | POA: Diagnosis not present

## 2019-12-05 DIAGNOSIS — Z23 Encounter for immunization: Secondary | ICD-10-CM | POA: Diagnosis not present

## 2020-02-16 DIAGNOSIS — Z0000 Encounter for general adult medical examination without abnormal findings: Secondary | ICD-10-CM | POA: Diagnosis not present

## 2020-02-16 DIAGNOSIS — E559 Vitamin D deficiency, unspecified: Secondary | ICD-10-CM | POA: Diagnosis not present

## 2020-02-16 DIAGNOSIS — E038 Other specified hypothyroidism: Secondary | ICD-10-CM | POA: Diagnosis not present

## 2020-02-16 DIAGNOSIS — R7301 Impaired fasting glucose: Secondary | ICD-10-CM | POA: Diagnosis not present

## 2020-02-16 DIAGNOSIS — E7849 Other hyperlipidemia: Secondary | ICD-10-CM | POA: Diagnosis not present

## 2020-02-18 DIAGNOSIS — E7849 Other hyperlipidemia: Secondary | ICD-10-CM | POA: Diagnosis not present

## 2020-02-18 DIAGNOSIS — E038 Other specified hypothyroidism: Secondary | ICD-10-CM | POA: Diagnosis not present

## 2020-02-18 DIAGNOSIS — Z0000 Encounter for general adult medical examination without abnormal findings: Secondary | ICD-10-CM | POA: Diagnosis not present

## 2020-02-18 DIAGNOSIS — R82998 Other abnormal findings in urine: Secondary | ICD-10-CM | POA: Diagnosis not present

## 2020-02-18 DIAGNOSIS — Z1389 Encounter for screening for other disorder: Secondary | ICD-10-CM | POA: Diagnosis not present

## 2020-02-18 DIAGNOSIS — Z1212 Encounter for screening for malignant neoplasm of rectum: Secondary | ICD-10-CM | POA: Diagnosis not present

## 2020-02-18 DIAGNOSIS — R7301 Impaired fasting glucose: Secondary | ICD-10-CM | POA: Diagnosis not present

## 2020-02-18 DIAGNOSIS — M859 Disorder of bone density and structure, unspecified: Secondary | ICD-10-CM | POA: Diagnosis not present

## 2020-07-02 DIAGNOSIS — Z23 Encounter for immunization: Secondary | ICD-10-CM | POA: Diagnosis not present

## 2020-11-14 ENCOUNTER — Ambulatory Visit
Admission: RE | Admit: 2020-11-14 | Discharge: 2020-11-14 | Disposition: A | Discharge status: Home / Self Care | Payer: BC Managed Care – PPO | Source: Ambulatory Visit | Attending: Obstetrics and Gynecology | Admitting: Obstetrics and Gynecology

## 2020-11-14 ENCOUNTER — Other Ambulatory Visit: Payer: Self-pay | Admitting: Obstetrics and Gynecology

## 2020-11-14 ENCOUNTER — Other Ambulatory Visit: Payer: Self-pay

## 2020-11-14 DIAGNOSIS — Z0000 Encounter for general adult medical examination without abnormal findings: Secondary | ICD-10-CM

## 2020-11-14 DIAGNOSIS — F1010 Alcohol abuse, uncomplicated: Secondary | ICD-10-CM | POA: Diagnosis not present

## 2020-11-14 DIAGNOSIS — Z1231 Encounter for screening mammogram for malignant neoplasm of breast: Secondary | ICD-10-CM | POA: Diagnosis not present

## 2020-11-16 DIAGNOSIS — E7800 Pure hypercholesterolemia, unspecified: Secondary | ICD-10-CM | POA: Diagnosis not present

## 2020-11-16 DIAGNOSIS — E039 Hypothyroidism, unspecified: Secondary | ICD-10-CM | POA: Diagnosis not present

## 2020-11-16 DIAGNOSIS — E559 Vitamin D deficiency, unspecified: Secondary | ICD-10-CM | POA: Diagnosis not present

## 2020-11-16 DIAGNOSIS — N951 Menopausal and female climacteric states: Secondary | ICD-10-CM | POA: Diagnosis not present

## 2020-11-16 DIAGNOSIS — R635 Abnormal weight gain: Secondary | ICD-10-CM | POA: Diagnosis not present

## 2020-11-18 DIAGNOSIS — Z1339 Encounter for screening examination for other mental health and behavioral disorders: Secondary | ICD-10-CM | POA: Diagnosis not present

## 2020-11-18 DIAGNOSIS — Z6833 Body mass index (BMI) 33.0-33.9, adult: Secondary | ICD-10-CM | POA: Diagnosis not present

## 2020-11-18 DIAGNOSIS — Z1331 Encounter for screening for depression: Secondary | ICD-10-CM | POA: Diagnosis not present

## 2020-11-18 DIAGNOSIS — I10 Essential (primary) hypertension: Secondary | ICD-10-CM | POA: Diagnosis not present

## 2020-11-18 DIAGNOSIS — N951 Menopausal and female climacteric states: Secondary | ICD-10-CM | POA: Diagnosis not present

## 2020-11-18 DIAGNOSIS — R635 Abnormal weight gain: Secondary | ICD-10-CM | POA: Diagnosis not present

## 2020-11-28 DIAGNOSIS — E039 Hypothyroidism, unspecified: Secondary | ICD-10-CM | POA: Diagnosis not present

## 2020-12-01 DIAGNOSIS — I10 Essential (primary) hypertension: Secondary | ICD-10-CM | POA: Diagnosis not present

## 2020-12-01 DIAGNOSIS — R7301 Impaired fasting glucose: Secondary | ICD-10-CM | POA: Diagnosis not present

## 2021-01-02 DIAGNOSIS — Z01419 Encounter for gynecological examination (general) (routine) without abnormal findings: Secondary | ICD-10-CM | POA: Diagnosis not present

## 2021-01-02 DIAGNOSIS — Z13820 Encounter for screening for osteoporosis: Secondary | ICD-10-CM | POA: Diagnosis not present

## 2021-01-02 DIAGNOSIS — Z6831 Body mass index (BMI) 31.0-31.9, adult: Secondary | ICD-10-CM | POA: Diagnosis not present

## 2021-02-13 DIAGNOSIS — E039 Hypothyroidism, unspecified: Secondary | ICD-10-CM | POA: Diagnosis not present

## 2021-02-13 DIAGNOSIS — E559 Vitamin D deficiency, unspecified: Secondary | ICD-10-CM | POA: Diagnosis not present

## 2021-02-13 DIAGNOSIS — Z0000 Encounter for general adult medical examination without abnormal findings: Secondary | ICD-10-CM | POA: Diagnosis not present

## 2021-02-13 DIAGNOSIS — R7301 Impaired fasting glucose: Secondary | ICD-10-CM | POA: Diagnosis not present

## 2021-02-13 DIAGNOSIS — E785 Hyperlipidemia, unspecified: Secondary | ICD-10-CM | POA: Diagnosis not present

## 2021-03-01 DIAGNOSIS — I10 Essential (primary) hypertension: Secondary | ICD-10-CM | POA: Diagnosis not present

## 2021-03-01 DIAGNOSIS — Z1339 Encounter for screening examination for other mental health and behavioral disorders: Secondary | ICD-10-CM | POA: Diagnosis not present

## 2021-03-01 DIAGNOSIS — Z1331 Encounter for screening for depression: Secondary | ICD-10-CM | POA: Diagnosis not present

## 2021-03-01 DIAGNOSIS — Z0000 Encounter for general adult medical examination without abnormal findings: Secondary | ICD-10-CM | POA: Diagnosis not present

## 2021-03-03 ENCOUNTER — Other Ambulatory Visit: Payer: Self-pay | Admitting: Endocrinology

## 2021-03-03 DIAGNOSIS — R7989 Other specified abnormal findings of blood chemistry: Secondary | ICD-10-CM

## 2021-03-03 DIAGNOSIS — R945 Abnormal results of liver function studies: Secondary | ICD-10-CM

## 2021-03-12 IMAGING — MG DIGITAL SCREENING BILATERAL MAMMOGRAM WITH TOMO AND CAD
8 series · 8 of 24 positions shown · non-contrast
Comparison: Previous exam(s).

CLINICAL DATA: Screening.

EXAM:
DIGITAL SCREENING BILATERAL MAMMOGRAM WITH TOMO AND CAD

[R CC synth-2D]
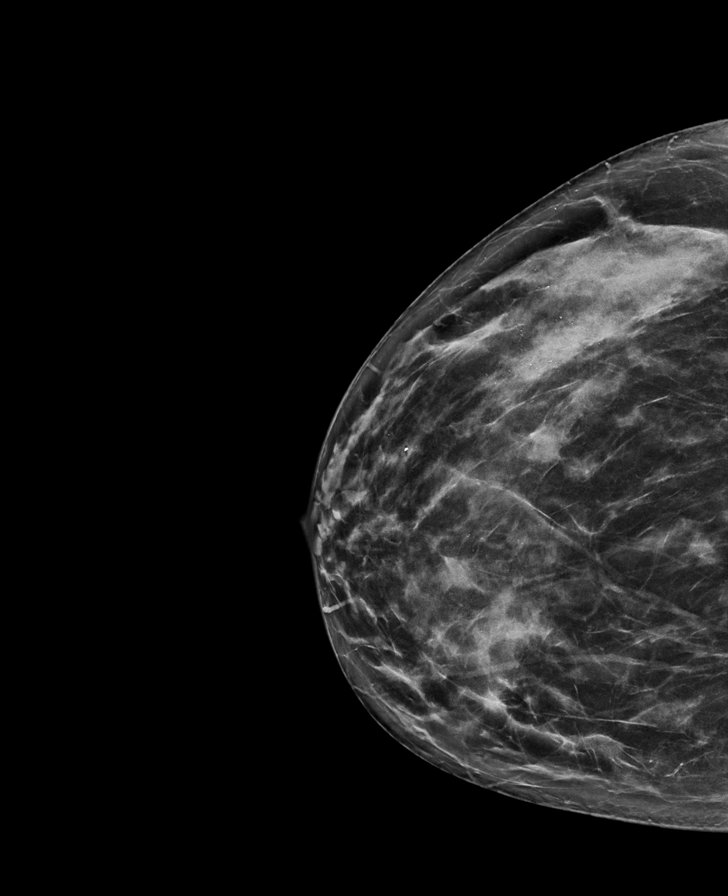

[R MLO synth-2D]
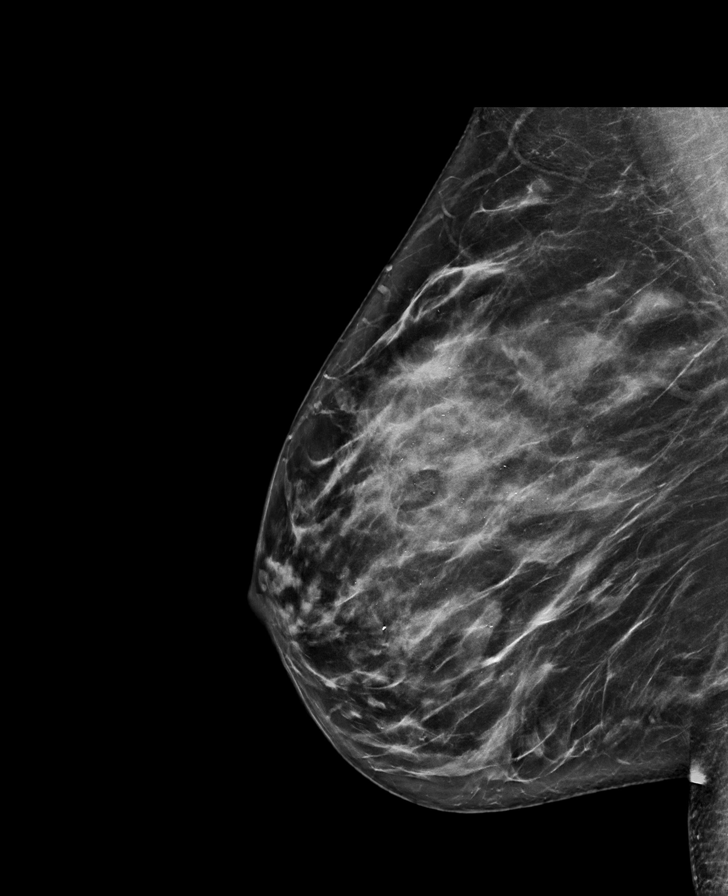

[L CC synth-2D]
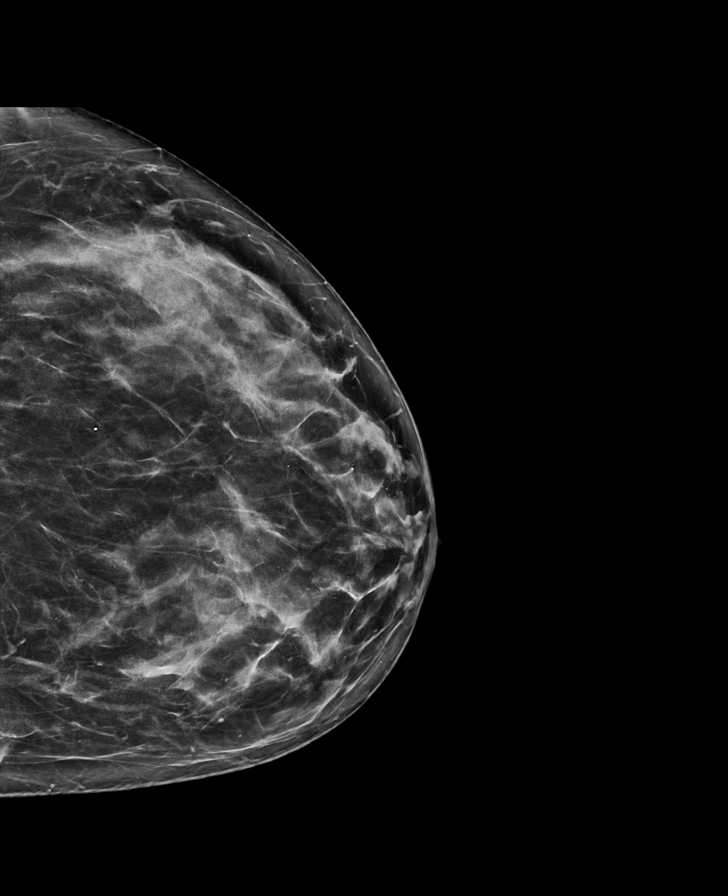

[L MLO synth-2D]
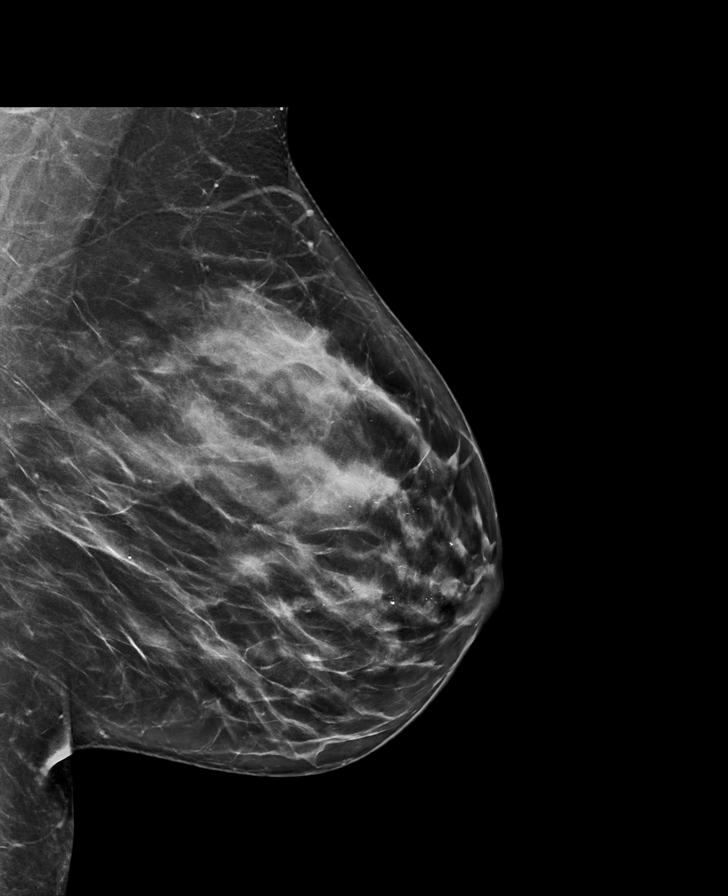

[R MLO tomo · tomo slice 41/81.0]
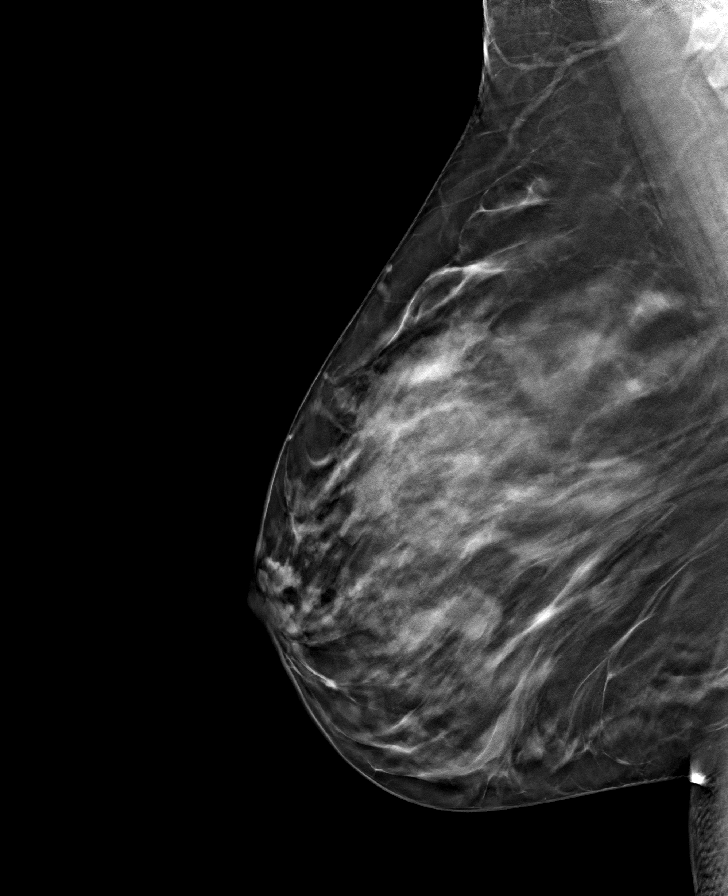

[L CC tomo · tomo slice 38/75.0]
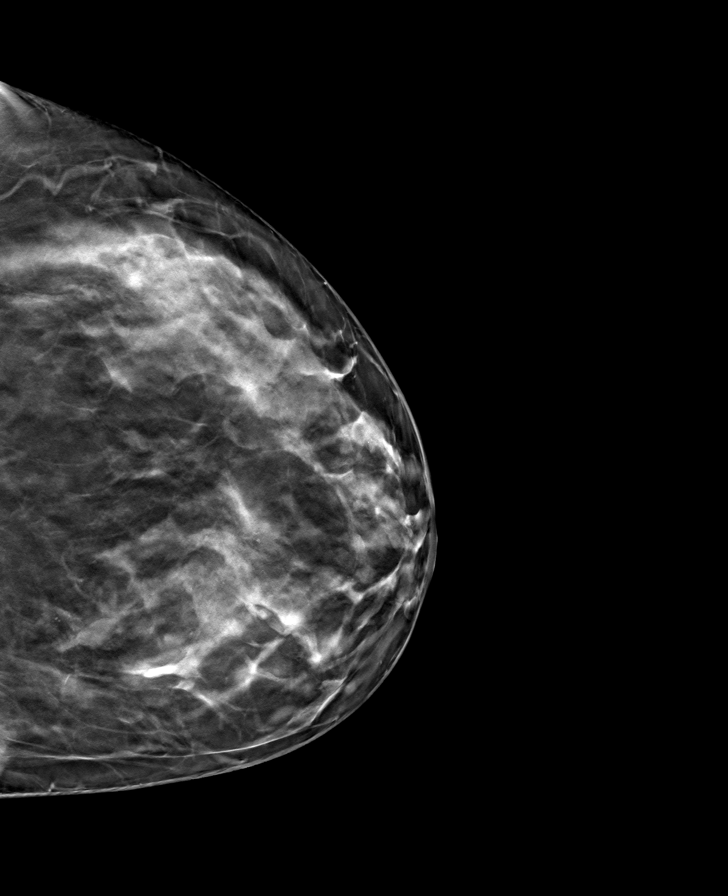

[R CC tomo · tomo slice 37/74.0]
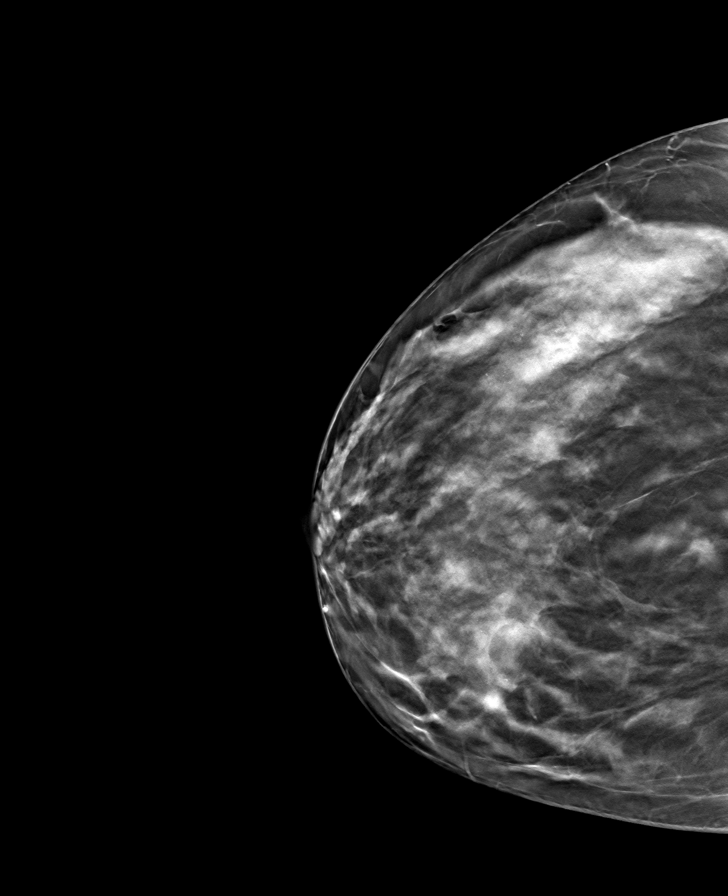

[L MLO tomo · tomo slice 43/84.0]
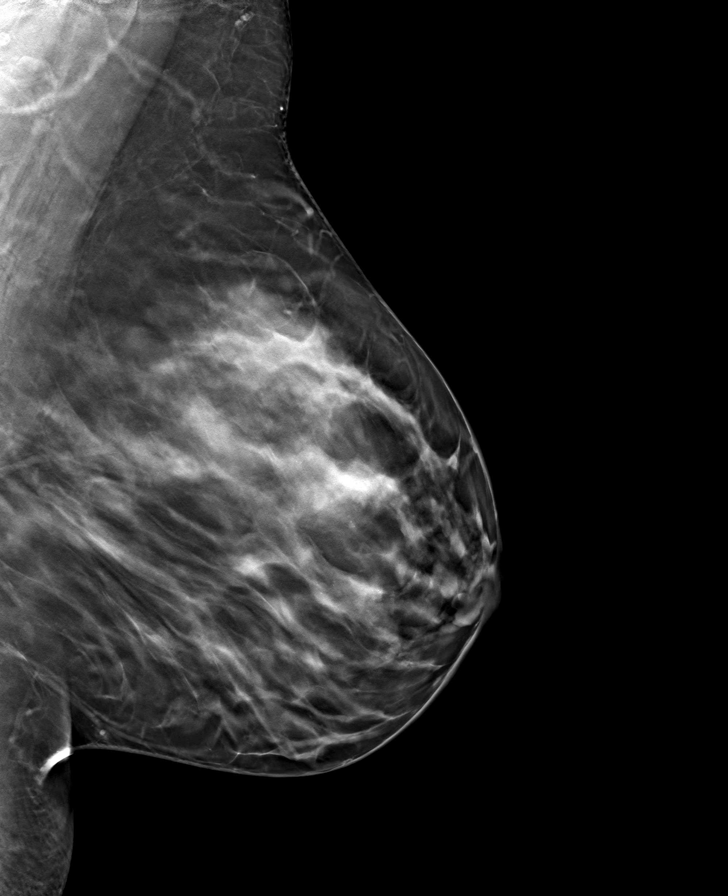

[8 of 24 positions shown; findings below may reference images not displayed]

ACR Breast Density Category c: The breast tissue is heterogeneously
dense, which may obscure small masses.
FINDINGS: There are no findings suspicious for malignancy. Images were
processed with CAD.
IMPRESSION: No mammographic evidence of malignancy. A result letter of this
screening mammogram will be mailed directly to the patient.

RECOMMENDATION:
Screening mammogram in one year. (Code:FT-U-LHB)

BI-RADS CATEGORY  1: Negative.

## 2021-03-22 ENCOUNTER — Ambulatory Visit
Admission: RE | Admit: 2021-03-22 | Discharge: 2021-03-22 | Disposition: A | Discharge status: Home / Self Care | Payer: BC Managed Care – PPO | Source: Ambulatory Visit | Attending: Endocrinology | Admitting: Endocrinology

## 2021-03-22 DIAGNOSIS — R945 Abnormal results of liver function studies: Secondary | ICD-10-CM

## 2021-03-22 DIAGNOSIS — K8020 Calculus of gallbladder without cholecystitis without obstruction: Secondary | ICD-10-CM | POA: Diagnosis not present

## 2021-03-22 DIAGNOSIS — R7989 Other specified abnormal findings of blood chemistry: Secondary | ICD-10-CM

## 2021-03-22 DIAGNOSIS — K760 Fatty (change of) liver, not elsewhere classified: Secondary | ICD-10-CM | POA: Diagnosis not present

## 2021-03-22 DIAGNOSIS — R110 Nausea: Secondary | ICD-10-CM | POA: Diagnosis not present

## 2021-04-13 DIAGNOSIS — R7301 Impaired fasting glucose: Secondary | ICD-10-CM | POA: Diagnosis not present

## 2021-04-28 DIAGNOSIS — L723 Sebaceous cyst: Secondary | ICD-10-CM | POA: Diagnosis not present

## 2021-05-18 DIAGNOSIS — L723 Sebaceous cyst: Secondary | ICD-10-CM | POA: Diagnosis not present

## 2021-06-17 DIAGNOSIS — Z23 Encounter for immunization: Secondary | ICD-10-CM | POA: Diagnosis not present

## 2021-08-15 DIAGNOSIS — E785 Hyperlipidemia, unspecified: Secondary | ICD-10-CM | POA: Diagnosis not present

## 2021-09-06 DIAGNOSIS — R7301 Impaired fasting glucose: Secondary | ICD-10-CM | POA: Diagnosis not present

## 2021-09-06 DIAGNOSIS — I10 Essential (primary) hypertension: Secondary | ICD-10-CM | POA: Diagnosis not present

## 2021-09-06 DIAGNOSIS — E039 Hypothyroidism, unspecified: Secondary | ICD-10-CM | POA: Diagnosis not present

## 2021-09-06 DIAGNOSIS — R7989 Other specified abnormal findings of blood chemistry: Secondary | ICD-10-CM | POA: Diagnosis not present

## 2021-09-12 DIAGNOSIS — I10 Essential (primary) hypertension: Secondary | ICD-10-CM | POA: Diagnosis not present

## 2021-11-07 ENCOUNTER — Other Ambulatory Visit: Payer: Self-pay | Admitting: Obstetrics and Gynecology

## 2021-11-07 DIAGNOSIS — Z1231 Encounter for screening mammogram for malignant neoplasm of breast: Secondary | ICD-10-CM

## 2021-11-15 ENCOUNTER — Ambulatory Visit
Admission: RE | Admit: 2021-11-15 | Discharge: 2021-11-15 | Disposition: A | Discharge status: Home / Self Care | Payer: BC Managed Care – PPO | Source: Ambulatory Visit | Attending: Obstetrics and Gynecology | Admitting: Obstetrics and Gynecology

## 2021-11-15 DIAGNOSIS — Z1231 Encounter for screening mammogram for malignant neoplasm of breast: Secondary | ICD-10-CM | POA: Diagnosis not present

## 2021-12-18 DIAGNOSIS — R7301 Impaired fasting glucose: Secondary | ICD-10-CM | POA: Diagnosis not present

## 2021-12-28 DIAGNOSIS — H04123 Dry eye syndrome of bilateral lacrimal glands: Secondary | ICD-10-CM | POA: Diagnosis not present

## 2021-12-28 DIAGNOSIS — H2513 Age-related nuclear cataract, bilateral: Secondary | ICD-10-CM | POA: Diagnosis not present

## 2022-03-05 DIAGNOSIS — R7301 Impaired fasting glucose: Secondary | ICD-10-CM | POA: Diagnosis not present

## 2022-03-05 DIAGNOSIS — E039 Hypothyroidism, unspecified: Secondary | ICD-10-CM | POA: Diagnosis not present

## 2022-03-05 DIAGNOSIS — Z79899 Other long term (current) drug therapy: Secondary | ICD-10-CM | POA: Diagnosis not present

## 2022-03-05 DIAGNOSIS — E785 Hyperlipidemia, unspecified: Secondary | ICD-10-CM | POA: Diagnosis not present

## 2022-03-09 DIAGNOSIS — I10 Essential (primary) hypertension: Secondary | ICD-10-CM | POA: Diagnosis not present

## 2022-03-09 DIAGNOSIS — Z1331 Encounter for screening for depression: Secondary | ICD-10-CM | POA: Diagnosis not present

## 2022-03-09 DIAGNOSIS — R82998 Other abnormal findings in urine: Secondary | ICD-10-CM | POA: Diagnosis not present

## 2022-03-09 DIAGNOSIS — Z0000 Encounter for general adult medical examination without abnormal findings: Secondary | ICD-10-CM | POA: Diagnosis not present

## 2022-03-09 DIAGNOSIS — Z1339 Encounter for screening examination for other mental health and behavioral disorders: Secondary | ICD-10-CM | POA: Diagnosis not present

## 2022-03-12 DIAGNOSIS — Z681 Body mass index (BMI) 19 or less, adult: Secondary | ICD-10-CM | POA: Diagnosis not present

## 2022-03-12 DIAGNOSIS — Z124 Encounter for screening for malignant neoplasm of cervix: Secondary | ICD-10-CM | POA: Diagnosis not present

## 2022-03-12 DIAGNOSIS — Z1151 Encounter for screening for human papillomavirus (HPV): Secondary | ICD-10-CM | POA: Diagnosis not present

## 2022-03-12 DIAGNOSIS — Z01419 Encounter for gynecological examination (general) (routine) without abnormal findings: Secondary | ICD-10-CM | POA: Diagnosis not present

## 2022-04-16 DIAGNOSIS — N390 Urinary tract infection, site not specified: Secondary | ICD-10-CM | POA: Diagnosis not present

## 2022-06-13 ENCOUNTER — Other Ambulatory Visit: Payer: Self-pay | Admitting: Physician Assistant

## 2022-06-13 DIAGNOSIS — E785 Hyperlipidemia, unspecified: Secondary | ICD-10-CM

## 2022-06-27 ENCOUNTER — Ambulatory Visit
Admission: RE | Admit: 2022-06-27 | Discharge: 2022-06-27 | Disposition: A | Discharge status: Home / Self Care | Payer: No Typology Code available for payment source | Source: Ambulatory Visit | Attending: Physician Assistant | Admitting: Physician Assistant

## 2022-06-27 DIAGNOSIS — E785 Hyperlipidemia, unspecified: Secondary | ICD-10-CM

## 2022-08-31 ENCOUNTER — Other Ambulatory Visit (HOSPITAL_COMMUNITY): Payer: Self-pay

## 2022-08-31 MED ORDER — WEGOVY 1.7 MG/0.75ML ~~LOC~~ SOAJ
1.7000 mg | SUBCUTANEOUS | 11 refills | Status: DC
  Filled 2022-08-31 – 2022-10-01 (×2): qty 3, 28d supply, fill #0
  Filled 2022-11-12: qty 3, 28d supply, fill #1
  Filled 2022-12-22: qty 3, 28d supply, fill #2
  Filled 2023-01-18: qty 3, 28d supply, fill #3
  Filled 2023-02-18: qty 3, 28d supply, fill #4
  Filled 2023-04-15: qty 3, 28d supply, fill #5
  Filled 2023-05-06 (×2): qty 3, 28d supply, fill #6
  Filled 2023-06-15 – 2023-06-21 (×5): qty 3, 28d supply, fill #7
  Filled 2023-07-22: qty 3, 28d supply, fill #8

## 2022-09-19 ENCOUNTER — Other Ambulatory Visit (HOSPITAL_COMMUNITY): Payer: Self-pay

## 2022-09-24 ENCOUNTER — Other Ambulatory Visit (HOSPITAL_COMMUNITY): Payer: Self-pay

## 2022-09-27 ENCOUNTER — Other Ambulatory Visit (HOSPITAL_COMMUNITY): Payer: Self-pay

## 2022-10-01 ENCOUNTER — Other Ambulatory Visit (HOSPITAL_COMMUNITY): Payer: Self-pay

## 2022-11-12 ENCOUNTER — Other Ambulatory Visit (HOSPITAL_COMMUNITY): Payer: Self-pay

## 2023-01-22 ENCOUNTER — Other Ambulatory Visit (HOSPITAL_COMMUNITY): Payer: Self-pay

## 2023-01-25 ENCOUNTER — Other Ambulatory Visit (HOSPITAL_COMMUNITY): Payer: Self-pay

## 2023-02-13 ENCOUNTER — Other Ambulatory Visit: Payer: Self-pay | Admitting: Obstetrics and Gynecology

## 2023-02-13 DIAGNOSIS — Z1231 Encounter for screening mammogram for malignant neoplasm of breast: Secondary | ICD-10-CM

## 2023-03-08 ENCOUNTER — Ambulatory Visit
Admission: RE | Admit: 2023-03-08 | Discharge: 2023-03-08 | Disposition: A | Discharge status: Home / Self Care | Payer: BC Managed Care – PPO | Source: Ambulatory Visit | Attending: Obstetrics and Gynecology | Admitting: Obstetrics and Gynecology

## 2023-03-08 DIAGNOSIS — Z1231 Encounter for screening mammogram for malignant neoplasm of breast: Secondary | ICD-10-CM

## 2023-03-12 ENCOUNTER — Other Ambulatory Visit: Payer: Self-pay | Admitting: Obstetrics and Gynecology

## 2023-03-12 DIAGNOSIS — R928 Other abnormal and inconclusive findings on diagnostic imaging of breast: Secondary | ICD-10-CM

## 2023-03-13 ENCOUNTER — Ambulatory Visit
Admission: RE | Admit: 2023-03-13 | Discharge: 2023-03-13 | Disposition: A | Discharge status: Home / Self Care | Payer: BC Managed Care – PPO | Source: Ambulatory Visit | Attending: Obstetrics and Gynecology | Admitting: Obstetrics and Gynecology

## 2023-03-13 DIAGNOSIS — R928 Other abnormal and inconclusive findings on diagnostic imaging of breast: Secondary | ICD-10-CM

## 2023-03-14 ENCOUNTER — Other Ambulatory Visit: Payer: Self-pay | Admitting: Obstetrics and Gynecology

## 2023-03-14 DIAGNOSIS — R921 Mammographic calcification found on diagnostic imaging of breast: Secondary | ICD-10-CM

## 2023-03-28 ENCOUNTER — Other Ambulatory Visit: Payer: Self-pay | Admitting: Obstetrics and Gynecology

## 2023-03-28 DIAGNOSIS — R92333 Mammographic heterogeneous density, bilateral breasts: Secondary | ICD-10-CM

## 2023-05-06 ENCOUNTER — Other Ambulatory Visit (HOSPITAL_COMMUNITY): Payer: Self-pay

## 2023-05-12 ENCOUNTER — Other Ambulatory Visit: Payer: BC Managed Care – PPO

## 2023-05-15 ENCOUNTER — Encounter: Payer: Self-pay | Admitting: Obstetrics and Gynecology

## 2023-05-20 ENCOUNTER — Ambulatory Visit
Admission: RE | Admit: 2023-05-20 | Discharge: 2023-05-20 | Disposition: A | Discharge status: Home / Self Care | Payer: BC Managed Care – PPO | Source: Ambulatory Visit | Attending: Obstetrics and Gynecology | Admitting: Obstetrics and Gynecology

## 2023-05-20 DIAGNOSIS — R92333 Mammographic heterogeneous density, bilateral breasts: Secondary | ICD-10-CM

## 2023-05-20 MED ORDER — GADOPICLENOL 0.5 MMOL/ML IV SOLN
5.0000 mL | Freq: Once | INTRAVENOUS | Status: AC | PRN
  Administered 2023-05-20: 5 mL via INTRAVENOUS

## 2023-06-17 ENCOUNTER — Other Ambulatory Visit (HOSPITAL_COMMUNITY): Payer: Self-pay

## 2023-06-21 ENCOUNTER — Other Ambulatory Visit (HOSPITAL_COMMUNITY): Payer: Self-pay

## 2023-06-24 ENCOUNTER — Other Ambulatory Visit (HOSPITAL_COMMUNITY): Payer: Self-pay

## 2023-07-18 ENCOUNTER — Encounter: Payer: Self-pay | Admitting: Obstetrics and Gynecology

## 2023-07-22 ENCOUNTER — Other Ambulatory Visit (HOSPITAL_COMMUNITY): Payer: Self-pay

## 2023-09-09 ENCOUNTER — Other Ambulatory Visit (HOSPITAL_COMMUNITY): Payer: Self-pay

## 2023-09-09 MED ORDER — WEGOVY 1.7 MG/0.75ML ~~LOC~~ SOAJ
1.7000 mg | SUBCUTANEOUS | 11 refills | Status: DC
  Filled 2023-09-09: qty 3, 28d supply, fill #0
  Filled 2023-10-08: qty 3, 28d supply, fill #1
  Filled 2023-11-05: qty 3, 28d supply, fill #2
  Filled 2023-12-12: qty 3, 28d supply, fill #3
  Filled 2024-01-15: qty 3, 28d supply, fill #4
  Filled 2024-02-11: qty 3, 28d supply, fill #5
  Filled 2024-03-12: qty 3, 28d supply, fill #6
  Filled 2024-04-06: qty 3, 28d supply, fill #7
  Filled 2024-05-12: qty 3, 28d supply, fill #8
  Filled 2024-06-14: qty 3, 28d supply, fill #9
  Filled 2024-07-14: qty 3, 28d supply, fill #10
  Filled 2024-08-22: qty 3, 28d supply, fill #11

## 2023-09-10 ENCOUNTER — Other Ambulatory Visit (HOSPITAL_COMMUNITY): Payer: Self-pay

## 2023-09-10 ENCOUNTER — Other Ambulatory Visit: Payer: Self-pay

## 2023-09-10 MED ORDER — WEGOVY 1.7 MG/0.75ML ~~LOC~~ SOAJ
1.7000 mg | SUBCUTANEOUS | 1 refills | Status: AC
  Filled 2023-09-10: qty 3, 28d supply, fill #0

## 2023-09-20 ENCOUNTER — Ambulatory Visit
Admission: RE | Admit: 2023-09-20 | Discharge: 2023-09-20 | Disposition: A | Discharge status: Home / Self Care | Payer: BC Managed Care – PPO | Source: Ambulatory Visit | Attending: Obstetrics and Gynecology | Admitting: Obstetrics and Gynecology

## 2023-09-20 DIAGNOSIS — R921 Mammographic calcification found on diagnostic imaging of breast: Secondary | ICD-10-CM

## 2023-10-14 ENCOUNTER — Other Ambulatory Visit (HOSPITAL_COMMUNITY): Payer: Self-pay

## 2023-11-11 ENCOUNTER — Other Ambulatory Visit (HOSPITAL_BASED_OUTPATIENT_CLINIC_OR_DEPARTMENT_OTHER): Payer: Self-pay

## 2023-12-20 ENCOUNTER — Other Ambulatory Visit (HOSPITAL_COMMUNITY): Payer: Self-pay

## 2023-12-26 ENCOUNTER — Other Ambulatory Visit (HOSPITAL_COMMUNITY): Payer: Self-pay

## 2023-12-27 ENCOUNTER — Ambulatory Visit (HOSPITAL_COMMUNITY)
Admission: RE | Admit: 2023-12-27 | Discharge: 2023-12-27 | Disposition: A | Discharge status: Home / Self Care | Source: Ambulatory Visit | Attending: Endocrinology | Admitting: Endocrinology

## 2023-12-27 DIAGNOSIS — M810 Age-related osteoporosis without current pathological fracture: Secondary | ICD-10-CM | POA: Insufficient documentation

## 2023-12-27 MED ORDER — ROMOSOZUMAB-AQQG 105 MG/1.17ML ~~LOC~~ SOSY
105.0000 mg | PREFILLED_SYRINGE | Freq: Once | SUBCUTANEOUS | Status: DC
  Administered 2023-12-27: 105 mg via SUBCUTANEOUS
  Filled 2023-12-27: qty 1.2

## 2024-01-02 ENCOUNTER — Other Ambulatory Visit (HOSPITAL_COMMUNITY): Payer: Self-pay | Admitting: *Deleted

## 2024-01-03 ENCOUNTER — Ambulatory Visit (HOSPITAL_COMMUNITY)
Admission: RE | Admit: 2024-01-03 | Discharge: 2024-01-03 | Disposition: A | Discharge status: Home / Self Care | Source: Ambulatory Visit | Attending: Endocrinology | Admitting: Endocrinology

## 2024-01-03 DIAGNOSIS — M810 Age-related osteoporosis without current pathological fracture: Secondary | ICD-10-CM | POA: Diagnosis present

## 2024-01-03 MED ORDER — ROMOSOZUMAB-AQQG 105 MG/1.17ML ~~LOC~~ SOSY
105.0000 mg | PREFILLED_SYRINGE | Freq: Once | SUBCUTANEOUS | Status: AC
  Administered 2024-01-03: 105 mg via SUBCUTANEOUS
  Filled 2024-01-03: qty 1.2

## 2024-01-27 ENCOUNTER — Encounter (HOSPITAL_COMMUNITY)

## 2024-01-30 ENCOUNTER — Other Ambulatory Visit (HOSPITAL_COMMUNITY): Payer: Self-pay | Admitting: *Deleted

## 2024-01-30 ENCOUNTER — Telehealth: Payer: Self-pay

## 2024-01-30 NOTE — Telephone Encounter (Addendum)
 Auth Submission: APPROVED Site of care: Site of care: [facility] Payer: [employer] commercial Medication & CPT/J Code(s) submitted: Even[place]  (Romosozumab ) [id number] Route of submission (phone, fax, portal):  Phone # Fax # Auth type: Buy/Bill PB Units/visits requested: 210mg  x 12 dsoes Reference number: [id number] Approval from: 12/11/23 to 12/09/24  I called [employer] and confirmed that this is the correct info for the approved prior auth. They were not able to fax me an auth letter. Ref #[id number]

## 2024-01-31 ENCOUNTER — Ambulatory Visit (HOSPITAL_COMMUNITY)
Admission: RE | Admit: 2024-01-31 | Discharge: 2024-01-31 | Disposition: A | Discharge status: Home / Self Care | Source: Ambulatory Visit | Attending: Endocrinology | Admitting: Endocrinology

## 2024-01-31 ENCOUNTER — Encounter (HOSPITAL_COMMUNITY)

## 2024-01-31 DIAGNOSIS — M810 Age-related osteoporosis without current pathological fracture: Secondary | ICD-10-CM | POA: Insufficient documentation

## 2024-01-31 MED ORDER — ROMOSOZUMAB-AQQG 105 MG/1.17ML ~~LOC~~ SOSY
210.0000 mg | PREFILLED_SYRINGE | Freq: Once | SUBCUTANEOUS | Status: AC
Start: 2024-01-31 — End: 2024-01-31
  Administered 2024-01-31: 210 mg via SUBCUTANEOUS
  Filled 2024-01-31: qty 2.4

## 2024-02-03 ENCOUNTER — Encounter (HOSPITAL_COMMUNITY)

## 2024-02-13 ENCOUNTER — Other Ambulatory Visit (HOSPITAL_COMMUNITY): Payer: Self-pay

## 2024-02-20 ENCOUNTER — Other Ambulatory Visit: Payer: Self-pay | Admitting: Obstetrics and Gynecology

## 2024-02-20 DIAGNOSIS — R921 Mammographic calcification found on diagnostic imaging of breast: Secondary | ICD-10-CM

## 2024-03-02 ENCOUNTER — Encounter (HOSPITAL_COMMUNITY)

## 2024-03-06 ENCOUNTER — Encounter (HOSPITAL_COMMUNITY)
Admission: RE | Admit: 2024-03-06 | Discharge: 2024-03-06 | Disposition: A | Discharge status: Home / Self Care | Source: Ambulatory Visit | Attending: Endocrinology | Admitting: Endocrinology

## 2024-03-06 DIAGNOSIS — M810 Age-related osteoporosis without current pathological fracture: Secondary | ICD-10-CM | POA: Diagnosis present

## 2024-03-06 MED ORDER — ROMOSOZUMAB-AQQG 105 MG/1.17ML ~~LOC~~ SOSY
210.0000 mg | PREFILLED_SYRINGE | Freq: Once | SUBCUTANEOUS | Status: DC
  Administered 2024-03-06: 210 mg via SUBCUTANEOUS
  Filled 2024-03-06: qty 2.4

## 2024-03-13 ENCOUNTER — Other Ambulatory Visit (HOSPITAL_COMMUNITY): Payer: Self-pay

## 2024-03-20 ENCOUNTER — Encounter

## 2024-04-06 ENCOUNTER — Ambulatory Visit (HOSPITAL_COMMUNITY)
Admission: RE | Admit: 2024-04-06 | Discharge: 2024-04-06 | Disposition: A | Discharge status: Home / Self Care | Source: Ambulatory Visit | Attending: Endocrinology | Admitting: Endocrinology

## 2024-04-06 DIAGNOSIS — M810 Age-related osteoporosis without current pathological fracture: Secondary | ICD-10-CM | POA: Insufficient documentation

## 2024-04-06 MED ORDER — ROMOSOZUMAB-AQQG 105 MG/1.17ML ~~LOC~~ SOSY
210.0000 mg | PREFILLED_SYRINGE | Freq: Once | SUBCUTANEOUS | Status: AC
  Administered 2024-04-06 (×2): 210 mg via SUBCUTANEOUS
  Filled 2024-04-06: qty 2.4

## 2024-04-10 ENCOUNTER — Ambulatory Visit
Admission: RE | Admit: 2024-04-10 | Discharge: 2024-04-10 | Disposition: A | Discharge status: Home / Self Care | Source: Ambulatory Visit | Attending: Obstetrics and Gynecology | Admitting: Obstetrics and Gynecology

## 2024-04-10 DIAGNOSIS — R921 Mammographic calcification found on diagnostic imaging of breast: Secondary | ICD-10-CM

## 2024-04-13 LAB — LAB REPORT - SCANNED
A1c: 4.8
Albumin, Urine POC: 12.1
Creatinine, POC: 67 mg/dL
EGFR: 103
Microalb Creat Ratio: 18

## 2024-05-05 DIAGNOSIS — M810 Age-related osteoporosis without current pathological fracture: Secondary | ICD-10-CM | POA: Insufficient documentation

## 2024-05-06 ENCOUNTER — Encounter (HOSPITAL_COMMUNITY)

## 2024-05-08 ENCOUNTER — Encounter (HOSPITAL_COMMUNITY)
Admission: RE | Admit: 2024-05-08 | Discharge: 2024-05-08 | Disposition: A | Discharge status: Home / Self Care | Source: Ambulatory Visit | Attending: Endocrinology | Admitting: Endocrinology

## 2024-05-08 VITALS — BP 119/78 | HR 81 | Temp 97.7°F | Resp 16

## 2024-05-08 DIAGNOSIS — M810 Age-related osteoporosis without current pathological fracture: Secondary | ICD-10-CM | POA: Insufficient documentation

## 2024-05-08 MED ORDER — ROMOSOZUMAB-AQQG 105 MG/1.17ML ~~LOC~~ SOSY
210.0000 mg | PREFILLED_SYRINGE | Freq: Once | SUBCUTANEOUS | Status: AC
  Administered 2024-05-08: 210 mg via SUBCUTANEOUS

## 2024-05-08 MED ORDER — ROMOSOZUMAB-AQQG 105 MG/1.17ML ~~LOC~~ SOSY
PREFILLED_SYRINGE | SUBCUTANEOUS | Status: AC
  Filled 2024-05-08: qty 2.34

## 2024-05-12 ENCOUNTER — Other Ambulatory Visit (HOSPITAL_COMMUNITY): Payer: Self-pay

## 2024-06-01 ENCOUNTER — Encounter (HOSPITAL_COMMUNITY)

## 2024-06-08 ENCOUNTER — Encounter (HOSPITAL_COMMUNITY)
Admission: RE | Admit: 2024-06-08 | Discharge: 2024-06-08 | Disposition: A | Discharge status: Home / Self Care | Source: Ambulatory Visit | Attending: Endocrinology | Admitting: Endocrinology

## 2024-06-08 VITALS — BP 119/75 | HR 82 | Temp 97.1°F | Resp 16

## 2024-06-08 DIAGNOSIS — M810 Age-related osteoporosis without current pathological fracture: Secondary | ICD-10-CM | POA: Diagnosis present

## 2024-06-08 MED ORDER — ROMOSOZUMAB-AQQG 105 MG/1.17ML ~~LOC~~ SOSY
210.0000 mg | PREFILLED_SYRINGE | Freq: Once | SUBCUTANEOUS | Status: AC
  Administered 2024-06-08: 210 mg via SUBCUTANEOUS

## 2024-06-08 MED ORDER — ROMOSOZUMAB-AQQG 105 MG/1.17ML ~~LOC~~ SOSY
PREFILLED_SYRINGE | SUBCUTANEOUS | Status: AC
  Filled 2024-06-08: qty 1.17

## 2024-06-15 ENCOUNTER — Other Ambulatory Visit (HOSPITAL_COMMUNITY): Payer: Self-pay

## 2024-07-08 ENCOUNTER — Inpatient Hospital Stay (HOSPITAL_COMMUNITY): Admission: RE | Admit: 2024-07-08 | Source: Ambulatory Visit

## 2024-07-10 ENCOUNTER — Ambulatory Visit (HOSPITAL_COMMUNITY)
Admission: RE | Admit: 2024-07-10 | Discharge: 2024-07-10 | Disposition: A | Discharge status: Home / Self Care | Source: Ambulatory Visit | Attending: Endocrinology | Admitting: Endocrinology

## 2024-07-10 VITALS — BP 127/84 | HR 76 | Temp 97.3°F | Resp 16

## 2024-07-10 DIAGNOSIS — M810 Age-related osteoporosis without current pathological fracture: Secondary | ICD-10-CM | POA: Diagnosis present

## 2024-07-10 MED ORDER — ROMOSOZUMAB-AQQG 105 MG/1.17ML ~~LOC~~ SOSY
210.0000 mg | PREFILLED_SYRINGE | Freq: Once | SUBCUTANEOUS | Status: AC
  Administered 2024-07-10: 210 mg via SUBCUTANEOUS

## 2024-07-10 MED ORDER — ROMOSOZUMAB-AQQG 105 MG/1.17ML ~~LOC~~ SOSY
PREFILLED_SYRINGE | SUBCUTANEOUS | Status: AC
Start: 2024-07-10 — End: 2024-07-10
  Filled 2024-07-10: qty 1.17

## 2024-08-07 ENCOUNTER — Encounter (HOSPITAL_COMMUNITY)

## 2024-08-11 ENCOUNTER — Inpatient Hospital Stay (HOSPITAL_COMMUNITY): Admission: RE | Admit: 2024-08-11 | Discharge: 2024-08-11 | Attending: Endocrinology | Admitting: Endocrinology

## 2024-08-11 VITALS — BP 101/77 | HR 72 | Temp 97.1°F

## 2024-08-11 DIAGNOSIS — M810 Age-related osteoporosis without current pathological fracture: Secondary | ICD-10-CM | POA: Diagnosis present

## 2024-08-11 MED ORDER — ROMOSOZUMAB-AQQG 105 MG/1.17ML ~~LOC~~ SOSY
PREFILLED_SYRINGE | SUBCUTANEOUS | Status: AC
  Filled 2024-08-11: qty 1.17

## 2024-08-11 MED ORDER — ROMOSOZUMAB-AQQG 105 MG/1.17ML ~~LOC~~ SOSY
210.0000 mg | PREFILLED_SYRINGE | Freq: Once | SUBCUTANEOUS | Status: AC
  Administered 2024-08-11: 08:00:00 210 mg via SUBCUTANEOUS

## 2024-08-18 ENCOUNTER — Encounter (HOSPITAL_COMMUNITY): Payer: Self-pay | Admitting: Endocrinology

## 2024-08-18 NOTE — Addendum Note (Signed)
 Addended by: DAYNE SHERRY RAMAN on: 08/18/2024 11:18 AM   Modules accepted: Orders

## 2024-09-10 ENCOUNTER — Encounter (HOSPITAL_COMMUNITY)

## 2024-09-11 ENCOUNTER — Encounter (HOSPITAL_COMMUNITY)

## 2024-09-11 ENCOUNTER — Ambulatory Visit (HOSPITAL_COMMUNITY)
Admission: RE | Admit: 2024-09-11 | Discharge: 2024-09-11 | Disposition: A | Discharge status: Home / Self Care | Source: Ambulatory Visit | Attending: Endocrinology | Admitting: Endocrinology

## 2024-09-11 VITALS — BP 119/88 | HR 74 | Temp 97.0°F | Resp 16

## 2024-09-11 DIAGNOSIS — M810 Age-related osteoporosis without current pathological fracture: Secondary | ICD-10-CM | POA: Diagnosis present

## 2024-09-11 MED ORDER — ROMOSOZUMAB-AQQG 105 MG/1.17ML ~~LOC~~ SOSY
210.0000 mg | PREFILLED_SYRINGE | Freq: Once | SUBCUTANEOUS | Status: AC
  Administered 2024-09-11: 210 mg via SUBCUTANEOUS

## 2024-09-11 MED ORDER — ROMOSOZUMAB-AQQG 105 MG/1.17ML ~~LOC~~ SOSY
PREFILLED_SYRINGE | SUBCUTANEOUS | Status: AC
  Filled 2024-09-11: qty 1.17

## 2024-09-14 ENCOUNTER — Other Ambulatory Visit (HOSPITAL_COMMUNITY): Payer: Self-pay

## 2024-09-14 MED ORDER — WEGOVY 1.7 MG/0.75ML ~~LOC~~ SOAJ
1.7000 mg | SUBCUTANEOUS | 11 refills | Status: AC
  Filled 2024-09-14: qty 3, 28d supply, fill #0

## 2024-10-12 ENCOUNTER — Encounter (HOSPITAL_COMMUNITY)
